# Patient Record
Sex: Female | Born: 1970 | Race: White | Hispanic: No | Marital: Married | Smoking: Former smoker
Health system: Southern US, Community
[De-identification: ages and names within clinical notes are randomized; demographics above are authoritative.]

## PROBLEM LIST (undated history)

## (undated) DIAGNOSIS — I720 Aneurysm of carotid artery: Secondary | ICD-10-CM

## (undated) DIAGNOSIS — I7773 Dissection of renal artery: Secondary | ICD-10-CM

## (undated) DIAGNOSIS — Z5189 Encounter for other specified aftercare: Secondary | ICD-10-CM

## (undated) DIAGNOSIS — I10 Essential (primary) hypertension: Secondary | ICD-10-CM

## (undated) DIAGNOSIS — M1990 Unspecified osteoarthritis, unspecified site: Secondary | ICD-10-CM

## (undated) HISTORY — PX: OTHER SURGICAL HISTORY: SHX169

## (undated) HISTORY — DX: Encounter for other specified aftercare: Z51.89

## (undated) HISTORY — DX: Aneurysm of carotid artery: I72.0

## (undated) HISTORY — DX: Essential (primary) hypertension: I10

## (undated) HISTORY — DX: Unspecified osteoarthritis, unspecified site: M19.90

## (undated) HISTORY — DX: Dissection of renal artery: I77.73

## (undated) HISTORY — PX: TUBAL LIGATION: SHX77

---

## 1998-09-12 ENCOUNTER — Other Ambulatory Visit: Admission: RE | Admit: 1998-09-12 | Discharge: 1998-09-12 | Payer: Self-pay | Admitting: Obstetrics & Gynecology

## 1999-08-27 ENCOUNTER — Other Ambulatory Visit: Admission: RE | Admit: 1999-08-27 | Discharge: 1999-08-27 | Payer: Self-pay | Admitting: Obstetrics and Gynecology

## 1999-11-09 ENCOUNTER — Inpatient Hospital Stay (HOSPITAL_COMMUNITY): Admission: AD | Admit: 1999-11-09 | Discharge: 1999-11-09 | Payer: Self-pay | Admitting: Obstetrics and Gynecology

## 2000-03-01 ENCOUNTER — Inpatient Hospital Stay (HOSPITAL_COMMUNITY): Admission: AD | Admit: 2000-03-01 | Discharge: 2000-03-01 | Payer: Self-pay | Admitting: Obstetrics & Gynecology

## 2000-03-26 ENCOUNTER — Inpatient Hospital Stay (HOSPITAL_COMMUNITY): Admission: AD | Admit: 2000-03-26 | Discharge: 2000-03-28 | Payer: Self-pay | Admitting: Obstetrics and Gynecology

## 2000-04-19 ENCOUNTER — Observation Stay (HOSPITAL_COMMUNITY): Admission: AD | Admit: 2000-04-19 | Discharge: 2000-04-20 | Payer: Self-pay | Admitting: Obstetrics and Gynecology

## 2000-04-19 ENCOUNTER — Encounter (INDEPENDENT_AMBULATORY_CARE_PROVIDER_SITE_OTHER): Payer: Self-pay

## 2000-04-19 ENCOUNTER — Encounter: Payer: Self-pay | Admitting: Obstetrics and Gynecology

## 2000-07-28 ENCOUNTER — Ambulatory Visit (HOSPITAL_COMMUNITY): Admission: RE | Admit: 2000-07-28 | Discharge: 2000-07-28 | Payer: Self-pay | Admitting: Obstetrics and Gynecology

## 2000-07-28 ENCOUNTER — Encounter: Payer: Self-pay | Admitting: Obstetrics and Gynecology

## 2000-09-24 ENCOUNTER — Ambulatory Visit (HOSPITAL_COMMUNITY): Admission: RE | Admit: 2000-09-24 | Discharge: 2000-09-24 | Payer: Self-pay | Admitting: Obstetrics and Gynecology

## 2000-10-23 ENCOUNTER — Other Ambulatory Visit: Admission: RE | Admit: 2000-10-23 | Discharge: 2000-10-23 | Payer: Self-pay | Admitting: Obstetrics and Gynecology

## 2002-01-10 ENCOUNTER — Other Ambulatory Visit: Admission: RE | Admit: 2002-01-10 | Discharge: 2002-01-10 | Payer: Self-pay | Admitting: Obstetrics and Gynecology

## 2002-08-19 ENCOUNTER — Inpatient Hospital Stay (HOSPITAL_COMMUNITY): Admission: AD | Admit: 2002-08-19 | Discharge: 2002-08-21 | Payer: Self-pay | Admitting: Obstetrics and Gynecology

## 2002-09-20 ENCOUNTER — Inpatient Hospital Stay (HOSPITAL_COMMUNITY): Admission: AD | Admit: 2002-09-20 | Discharge: 2002-09-20 | Payer: Self-pay | Admitting: Obstetrics and Gynecology

## 2002-09-26 ENCOUNTER — Inpatient Hospital Stay (HOSPITAL_COMMUNITY): Admission: AD | Admit: 2002-09-26 | Discharge: 2002-09-30 | Payer: Self-pay | Admitting: Obstetrics & Gynecology

## 2002-09-27 ENCOUNTER — Encounter (INDEPENDENT_AMBULATORY_CARE_PROVIDER_SITE_OTHER): Payer: Self-pay

## 2003-02-16 ENCOUNTER — Other Ambulatory Visit: Admission: RE | Admit: 2003-02-16 | Discharge: 2003-02-16 | Payer: Self-pay | Admitting: Obstetrics and Gynecology

## 2003-06-27 ENCOUNTER — Emergency Department (HOSPITAL_COMMUNITY): Admission: EM | Admit: 2003-06-27 | Discharge: 2003-06-28 | Payer: Self-pay | Admitting: Emergency Medicine

## 2004-08-21 ENCOUNTER — Other Ambulatory Visit: Admission: RE | Admit: 2004-08-21 | Discharge: 2004-08-21 | Payer: Self-pay | Admitting: Obstetrics and Gynecology

## 2006-05-03 ENCOUNTER — Emergency Department (HOSPITAL_COMMUNITY): Admission: EM | Admit: 2006-05-03 | Discharge: 2006-05-03 | Payer: Self-pay | Admitting: Emergency Medicine

## 2007-03-11 ENCOUNTER — Ambulatory Visit: Payer: Self-pay | Admitting: *Deleted

## 2007-03-11 DIAGNOSIS — I720 Aneurysm of carotid artery: Secondary | ICD-10-CM

## 2007-03-11 HISTORY — DX: Aneurysm of carotid artery: I72.0

## 2007-03-12 ENCOUNTER — Encounter: Admission: RE | Admit: 2007-03-12 | Discharge: 2007-03-12 | Payer: Self-pay | Admitting: *Deleted

## 2007-03-18 ENCOUNTER — Ambulatory Visit (HOSPITAL_COMMUNITY): Admission: RE | Admit: 2007-03-18 | Discharge: 2007-03-18 | Payer: Self-pay | Admitting: Interventional Radiology

## 2007-06-21 ENCOUNTER — Encounter: Admission: RE | Admit: 2007-06-21 | Discharge: 2007-06-21 | Payer: Self-pay | Admitting: Gastroenterology

## 2007-06-24 ENCOUNTER — Ambulatory Visit: Payer: Self-pay | Admitting: *Deleted

## 2007-10-28 ENCOUNTER — Ambulatory Visit: Payer: Self-pay | Admitting: *Deleted

## 2007-10-28 ENCOUNTER — Encounter: Admission: RE | Admit: 2007-10-28 | Discharge: 2007-10-28 | Payer: Self-pay | Admitting: *Deleted

## 2008-02-17 ENCOUNTER — Ambulatory Visit: Payer: Self-pay | Admitting: *Deleted

## 2008-02-17 ENCOUNTER — Encounter: Admission: RE | Admit: 2008-02-17 | Discharge: 2008-02-17 | Payer: Self-pay | Admitting: *Deleted

## 2008-06-15 ENCOUNTER — Encounter: Admission: RE | Admit: 2008-06-15 | Discharge: 2008-06-15 | Payer: Self-pay | Admitting: *Deleted

## 2008-06-15 ENCOUNTER — Ambulatory Visit: Payer: Self-pay | Admitting: *Deleted

## 2008-12-21 ENCOUNTER — Ambulatory Visit: Payer: Self-pay | Admitting: *Deleted

## 2008-12-21 ENCOUNTER — Encounter: Admission: RE | Admit: 2008-12-21 | Discharge: 2008-12-21 | Payer: Self-pay | Admitting: *Deleted

## 2009-06-20 ENCOUNTER — Emergency Department (HOSPITAL_COMMUNITY): Admission: EM | Admit: 2009-06-20 | Discharge: 2009-06-21 | Payer: Self-pay | Admitting: Emergency Medicine

## 2009-06-23 ENCOUNTER — Ambulatory Visit: Payer: Self-pay | Admitting: Surgery

## 2009-06-23 ENCOUNTER — Inpatient Hospital Stay (HOSPITAL_COMMUNITY): Admission: EM | Admit: 2009-06-23 | Discharge: 2009-06-28 | Payer: Self-pay | Admitting: Emergency Medicine

## 2009-06-25 ENCOUNTER — Encounter: Payer: Self-pay | Admitting: Surgery

## 2009-07-01 ENCOUNTER — Inpatient Hospital Stay (HOSPITAL_COMMUNITY): Admission: EM | Admit: 2009-07-01 | Discharge: 2009-07-04 | Payer: Self-pay | Admitting: Emergency Medicine

## 2009-07-23 ENCOUNTER — Ambulatory Visit: Payer: Self-pay | Admitting: Surgery

## 2009-07-27 ENCOUNTER — Encounter: Admission: RE | Admit: 2009-07-27 | Discharge: 2009-07-27 | Payer: Self-pay | Admitting: Surgery

## 2009-07-30 ENCOUNTER — Ambulatory Visit: Payer: Self-pay | Admitting: Surgery

## 2009-10-29 ENCOUNTER — Ambulatory Visit (HOSPITAL_COMMUNITY): Admission: RE | Admit: 2009-10-29 | Discharge: 2009-10-29 | Payer: Self-pay | Admitting: Surgery

## 2009-10-31 ENCOUNTER — Ambulatory Visit (HOSPITAL_COMMUNITY): Admission: RE | Admit: 2009-10-31 | Discharge: 2009-10-31 | Payer: Self-pay | Admitting: Surgery

## 2009-11-12 ENCOUNTER — Ambulatory Visit: Payer: Self-pay | Admitting: Surgery

## 2010-06-06 ENCOUNTER — Encounter: Admission: RE | Admit: 2010-06-06 | Discharge: 2010-06-06 | Payer: Self-pay | Admitting: Surgery

## 2010-06-07 ENCOUNTER — Encounter: Admission: RE | Admit: 2010-06-07 | Discharge: 2010-06-07 | Payer: Self-pay | Admitting: Surgery

## 2010-06-10 ENCOUNTER — Ambulatory Visit: Payer: Self-pay | Admitting: Surgery

## 2010-12-02 ENCOUNTER — Encounter: Payer: Self-pay | Admitting: Surgery

## 2010-12-02 ENCOUNTER — Encounter: Payer: Self-pay | Admitting: *Deleted

## 2011-02-10 LAB — BUN: BUN: 12 mg/dL (ref 6–23)

## 2011-02-10 LAB — CREATININE, SERUM
Creatinine, Ser: 0.73 mg/dL (ref 0.4–1.2)
GFR calc Af Amer: >60 mL/min (ref >60)

## 2011-02-15 LAB — COMPREHENSIVE METABOLIC PANEL
AST: 23 U/L (ref 0–37)
Albumin: 4.1 g/dL (ref 3.5–5.2)
BUN: 9 mg/dL (ref 6–23)
Calcium: 9.2 mg/dL (ref 8.4–10.5)
Chloride: 100 mEq/L (ref 96–112)
Creatinine, Ser: 0.69 mg/dL (ref 0.4–1.2)
GFR calc Af Amer: >60 mL/min (ref >60)
Total Bilirubin: 0.7 mg/dL (ref 0.3–1.2)
Total Protein: 7.6 g/dL (ref 6.0–8.3)

## 2011-02-15 LAB — FACTOR 5 LEIDEN

## 2011-02-15 LAB — DIFFERENTIAL
Basophils Absolute: 0.1 10*3/uL (ref 0.0–0.1)
Basophils Absolute: 0.1 10*3/uL (ref 0.0–0.1)
Basophils Relative: 1 % (ref 0–1)
Basophils Relative: 0 % (ref 0–1)
Eosinophils Absolute: 0.1 10*3/uL (ref 0.0–0.7)
Eosinophils Absolute: 0.2 10*3/uL (ref 0.0–0.7)
Eosinophils Relative: 1 % (ref 0–5)
Lymphocytes Relative: 19 % (ref 12–46)
Lymphocytes Relative: 11 % — ABNORMAL LOW (ref 12–46)
Lymphocytes Relative: 18 % (ref 12–46)
Lymphs Abs: 1.5 10*3/uL (ref 0.7–4.0)
Lymphs Abs: 1.6 10*3/uL (ref 0.7–4.0)
Monocytes Absolute: 1.2 10*3/uL — ABNORMAL HIGH (ref 0.1–1.0)
Monocytes Relative: 10 % (ref 3–12)
Monocytes Relative: 12 % (ref 3–12)
Neutro Abs: 4.2 10*3/uL (ref 1.7–7.7)
Neutro Abs: 10.6 10*3/uL — ABNORMAL HIGH (ref 1.7–7.7)
Neutrophils Relative %: 68 % (ref 43–77)
Neutrophils Relative %: 68 % (ref 43–77)

## 2011-02-15 LAB — URINE CULTURE
Colony Count: 4000
Colony Count: NO GROWTH

## 2011-02-15 LAB — URINALYSIS, ROUTINE W REFLEX MICROSCOPIC
Glucose, UA: NEGATIVE mg/dL
Hgb urine dipstick: NEGATIVE
Ketones, ur: >80 mg/dL — AB
Leukocytes, UA: NEGATIVE
Leukocytes, UA: NEGATIVE
Nitrite: NEGATIVE
Nitrite: NEGATIVE
Nitrite: NEGATIVE
Protein, ur: NEGATIVE mg/dL
Protein, ur: NEGATIVE mg/dL
Specific Gravity, Urine: 1.026 (ref 1.005–1.030)
Specific Gravity, Urine: 1.017 (ref 1.005–1.030)
Urobilinogen, UA: 1 mg/dL (ref 0.0–1.0)
Urobilinogen, UA: 0.2 mg/dL (ref 0.0–1.0)
Urobilinogen, UA: 1 mg/dL (ref 0.0–1.0)
pH: 7 (ref 5.0–8.0)

## 2011-02-15 LAB — POTASSIUM: Potassium: 4.3 mEq/L (ref 3.5–5.1)

## 2011-02-15 LAB — CBC
HCT: 36.5 % (ref 36.0–46.0)
HCT: 41.4 % (ref 36.0–46.0)
HCT: 43.1 % (ref 36.0–46.0)
Hemoglobin: 12.5 g/dL (ref 12.0–15.0)
MCHC: 34.4 g/dL (ref 30.0–36.0)
MCHC: 34.6 g/dL (ref 30.0–36.0)
MCHC: 34.8 g/dL (ref 30.0–36.0)
MCHC: 34.5 g/dL (ref 30.0–36.0)
MCHC: 35.3 g/dL (ref 30.0–36.0)
MCHC: 35.6 g/dL (ref 30.0–36.0)
MCV: 89.1 fL (ref 78.0–100.0)
MCV: 85.4 fL (ref 78.0–100.0)
MCV: 86 fL (ref 78.0–100.0)
MCV: 87.1 fL (ref 78.0–100.0)
MCV: 87.5 fL (ref 78.0–100.0)
Platelets: 407 10*3/uL — ABNORMAL HIGH (ref 150–400)
Platelets: 302 10*3/uL (ref 150–400)
Platelets: 349 10*3/uL (ref 150–400)
RBC: 4.07 MIL/uL (ref 3.87–5.11)
RBC: 4.57 MIL/uL (ref 3.87–5.11)
RBC: 4.19 MIL/uL (ref 3.87–5.11)
RBC: 4.54 MIL/uL (ref 3.87–5.11)
RDW: 12.7 % (ref 11.5–15.5)
RDW: 12.4 % (ref 11.5–15.5)
RDW: 12.6 % (ref 11.5–15.5)
WBC: 6.1 10*3/uL (ref 4.0–10.5)
WBC: 9.1 10*3/uL (ref 4.0–10.5)

## 2011-02-15 LAB — BASIC METABOLIC PANEL
BUN: 7 mg/dL (ref 6–23)
BUN: 8 mg/dL (ref 6–23)
CO2: 27 mEq/L (ref 19–32)
CO2: 27 mEq/L (ref 19–32)
CO2: 24 mEq/L (ref 19–32)
Calcium: 8.5 mg/dL (ref 8.4–10.5)
Calcium: 8.8 mg/dL (ref 8.4–10.5)
Calcium: 8.9 mg/dL (ref 8.4–10.5)
Chloride: 101 mEq/L (ref 96–112)
Chloride: 103 mEq/L (ref 96–112)
Chloride: 102 mEq/L (ref 96–112)
Chloride: 104 mEq/L (ref 96–112)
Chloride: 96 mEq/L (ref 96–112)
Creatinine, Ser: 0.62 mg/dL (ref 0.4–1.2)
Creatinine, Ser: 0.63 mg/dL (ref 0.4–1.2)
Creatinine, Ser: 0.75 mg/dL (ref 0.4–1.2)
Creatinine, Ser: 0.57 mg/dL (ref 0.4–1.2)
Creatinine, Ser: 0.69 mg/dL (ref 0.4–1.2)
GFR calc Af Amer: >60 mL/min (ref >60)
GFR calc Af Amer: >60 mL/min (ref >60)
GFR calc Af Amer: >60 mL/min (ref >60)
GFR calc Af Amer: >60 mL/min (ref >60)
GFR calc Af Amer: >60 mL/min (ref >60)
GFR calc non Af Amer: >60 mL/min (ref >60)
GFR calc non Af Amer: >60 mL/min (ref >60)
GFR calc non Af Amer: >60 mL/min (ref >60)
Glucose, Bld: 94 mg/dL (ref 70–99)
Potassium: 3.9 mEq/L (ref 3.5–5.1)
Potassium: 3 mEq/L — ABNORMAL LOW (ref 3.5–5.1)
Potassium: 4.2 mEq/L (ref 3.5–5.1)
Sodium: 137 mEq/L (ref 135–145)
Sodium: 137 mEq/L (ref 135–145)
Sodium: 137 mEq/L (ref 135–145)

## 2011-02-15 LAB — RENAL FUNCTION PANEL
CO2: 27 mEq/L (ref 19–32)
Chloride: 100 mEq/L (ref 96–112)
Glucose, Bld: 95 mg/dL (ref 70–99)
Potassium: 2.9 mEq/L — ABNORMAL LOW (ref 3.5–5.1)
Sodium: 133 mEq/L — ABNORMAL LOW (ref 135–145)

## 2011-02-15 LAB — POCT I-STAT, CHEM 8
BUN: 11 mg/dL (ref 6–23)
Calcium, Ion: 1.14 mmol/L (ref 1.12–1.32)
Chloride: 103 mEq/L (ref 96–112)
Glucose, Bld: 93 mg/dL (ref 70–99)
HCT: 42 % (ref 36.0–46.0)

## 2011-02-15 LAB — PROTEIN C ACTIVITY: Protein C Activity: 131 % (ref 75–133)

## 2011-02-15 LAB — BETA-2-GLYCOPROTEIN I ABS, IGG/M/A
Beta-2 Glyco I IgG: <20 U/mL (ref <20)
Beta-2-Glycoprotein I IgA: <10 U/mL (ref <20)

## 2011-02-15 LAB — URINE MICROSCOPIC-ADD ON

## 2011-02-15 LAB — LUPUS ANTICOAGULANT PANEL: Lupus Anticoagulant: NOT DETECTED

## 2011-02-15 LAB — APTT: aPTT: 29 seconds (ref 24–37)

## 2011-02-15 LAB — PROTIME-INR
INR: 0.9 (ref 0.00–1.49)
INR: 1.1 (ref 0.00–1.49)

## 2011-02-15 LAB — PROTEIN S ACTIVITY: Protein S Activity: 73 % (ref 69–129)

## 2011-02-15 LAB — FACTOR 8 ASSAY: Coagulation Factor VIII: 112 % (ref 73–140)

## 2011-02-15 LAB — TYPE AND SCREEN: ABO/RH(D): O POS

## 2011-02-15 LAB — CARDIOLIPIN ANTIBODIES, IGG, IGM, IGA: Anticardiolipin IgA: <10 [APL'U] — ABNORMAL LOW (ref <12)

## 2011-02-15 LAB — RHEUMATOID FACTOR: Rhuematoid fact SerPl-aCnc: 22 IU/mL — ABNORMAL HIGH (ref 0–20)

## 2011-02-15 LAB — HOMOCYSTEINE: Homocysteine: 7.7 umol/L (ref 4.0–15.4)

## 2011-02-15 LAB — PROTEIN S, TOTAL: Protein S Ag, Total: 95 % (ref 70–140)

## 2011-02-15 LAB — PROTEIN C, TOTAL: Protein C, Total: 92 % (ref 70–140)

## 2011-03-25 ENCOUNTER — Other Ambulatory Visit: Payer: Self-pay | Admitting: Surgery

## 2011-03-25 NOTE — Assessment & Plan Note (Signed)
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   A CT angiogram was carried out today.  This reveals no significant  change with evidence of a saccular pseudoaneurysm of the left internal  carotid artery at the base of the skull.  The right internal carotid  artery appears normal at this time.   She remains on aspirin 325 mg daily.  Occasional headaches.  No visual  disturbance.  No sensory or motor deficit.  Denies speech problems.   The patient appears generally well.  BP 132/95, pulse 77 per minute and  regular, respirations 16 per minute.  No carotid bruits.  Cranial nerves  intact.  Strength equal bilaterally.   I have had a long discussion with the patient and her husband regarding  her employment as a Runner, broadcasting/film/video.  I do think it would be in her best  interest at this time to remain off work for the time-being until  further followup can be arranged regarding this pseudoaneurysm in her  left internal carotid artery.  She will remain excused from work at this  time from March 09, 2007 until November 11, 2007.  I will plan to follow up with her in mid December with a  repeat CT angiogram of the neck at that time.   Balinda Quails, M.D.  Electronically Signed   PGH/MEDQ  D:  06/24/2007  T:  06/25/2007  Job:  [id number]   cc:   Donalee Citrin, M.D.  Duncan Dull, M.D.

## 2011-03-25 NOTE — Assessment & Plan Note (Signed)
OFFICE VISIT   Brittany Gibson, Brittany Gibson  DOB:  12/01/1970                                       06/10/2010  EAVWU#:[id number]   REASON FOR VISIT:  Follow up carotid pseudoaneurysm and left renal  dissection.   HISTORY:  This is a 40 year old female well-known to me for a left renal  artery dissection.  She first met Dr. Madilyn Fireman in May 2008 for carotid  pseudoaneurysm which was following a trauma.  I had sent her for a  vasculitis workup.  She comes back today on her continued surveillance  program.  She states that approximately 6 months ago she had a stabbing-  like pain on her right flank which did spontaneously resolve.  She has  been having right-sided back pain for the past 2-3 days.  She feels this  may be cyclical.  Otherwise, however, she is doing well.  She has seen  Dr. Peggye Form who has raised the possibility that this could be a variation  of fibromuscular dysplasia.   REVIEW OF SYSTEMS:  GENERAL:  Negative for fevers, chills, weight gain,  weight loss.  VASCULAR:  Is as above.  CARDIAC:  Negative for chest pain or chest pressure.  GI:  Positive for back and flank pain on the right side.  PULMONARY:  Negative.  HEME:  Negative.  GU:  Negative.  ENT:  Negative.  MUSCULOSKELETAL:  Negative.   All other review of systems are negative as documented in the encounter  form.   PHYSICAL EXAMINATION:  Heart rate 91, blood pressure 122/90, respiratory  rate 20.  General:  She is well-appearing, in no distress.  HEENT:  Within normal limits.  Respirations are nonlabored.  Cardiovascular:  Regular rate and rhythm.  Abdomen:  Soft, nontender.  There is abdominal  bruit more prominent on the left.  Musculoskeletal:  Without major  deformities.  Neuro:  She has no focal deficits or weakness.  Skin:  Without rash.   DIAGNOSTIC STUDIES:  I have reviewed her MRA of her abdomen which shows  stable narrowing of the left renal artery without new dissection.  MRA  of  the neck reveals stable left carotid pseudoaneurysm at the base of  the skull.   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   PLAN:  Actually has no new symptoms today.  Her MRAs are stable in  appearance.  We will plan on continuing her surveillance with an MRA.  The next scan will be in 1 year.   I spoke with Dr. Peggye Form regarding the possibility of fibromuscular  dysplasia after having reviewed some of the articles.  This could  potentially represent a variation of fibromuscular dysplasia where the  string of bead appearance is not prominent.  I told the patient that I  see multiple cases of fibromuscular dysplasia per year and they all have  the classic string of beads appearance on angiography.  She does not  have this finding; however, there does appear to be a variation which  does not have these classic angiographic findings which could  potentially be the etiology of her dissections.  I do not think we will  ever be able to prove this, but it is certainly a possibility.     Jorge Ny, MD  Electronically Signed   VWB/MEDQ  D:  06/10/2010  T:  06/11/2010  Job:  [id number]   cc:   [name], M.D.

## 2011-03-25 NOTE — Assessment & Plan Note (Signed)
OFFICE VISIT   Brittany Gibson, COIRO B  DOB:  23-May-1971                                       07/30/2009  ZOXWR#:[id number]   REASON FOR VISIT:  Follow up left renal dissection.   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  She does also have a history of traumatic pseudoaneurysm of her  bilateral internal carotid artery.  She had been followed by Dr. Madilyn Fireman  for this.  Her most recent evaluation was in February with CT scan which  showed distal left renal artery pseudoaneurysm measuring 1.4-mm.  Due to  her history, she has been seen by a rheumatologist, her workup was  negative.  She called the office last week and had new pain on the right  side of her body and for that reason I elected to repeat a CT scan to  further evaluate this to see if she developed something on the right  side.  She has a myriad of complaints today including tingling and sharp  pains in her left arm, also in her left flank and in her left back.  She  is concerned that she may have walking pneumonia as her daughter has  been treated with a Z-Pak.  She is also concerned that this all started  when she started taking Plavix and was concerned that this may be the  result of her Plavix.   PHYSICAL EXAMINATION:  Blood pressure 120/80, pulse 78.  She is well-  appearing, in no distress.  Her abdomen is soft, nontender.  She has no  costochondral tenderness.  Her lungs are clear.  Carotid arteries are  clear.   CT scan shows minimal change in the dissection of the left main renal  artery, there is improved enhancement in the periphery of the kidney.   ASSESSMENT/PLAN:  Spontaneous left renal artery dissection, traumatic  bilateral carotid dissection,   PLAN:  Patient will be followed up in 3 months with an MRA of her neck  and abdomen.  She is concerned that some of her symptoms started  with  her Plavix; therefore, I think it is okay to stop her Plavix at this  point in time, I am going to maintain her on a full aspirin.  She is set  to see rheumatology this Friday.  There has been discussion of a  possible Ehlers-Danlos syndrome.  Again, I will see her back in 3  months.   Jorge Ny, MD  Electronically Signed   VWB/MEDQ  D:  07/30/2009  T:  07/31/2009  Job:  [id number]

## 2011-03-25 NOTE — Assessment & Plan Note (Signed)
OFFICE VISIT   Brittany Gibson, Brittany Gibson  DOB:  [birth year]                                       12/21/2008  ZOXWR#:[id number]   The patient returns today for continued follow-up of traumatic left  internal carotid dissection with pseudoaneurysm formation.  Followed on  a regular basis.  No recent headaches.  Denies pulsatile tinnitus.  No  recent neck pain.  No visual disturbance.  No motor, sensory or speech  problems.   CT angiogram reveals a stable pseudoaneurysm of the distal left internal  carotid artery at the skull base 1.4-mm in diameter.   Patient appears well.  Alert and oriented.  BP is 143/88 left arm,  131/86 right arm, pulse is 84 per minute.  Cranial nerves are intact.  Strength equal bilaterally.  Reflexes 1+.  No carotid bruits.   The patient remains quite stable without change in distal left internal  carotid artery pseudoaneurysm.  Will plan follow-up again in 6 months  with repeat CT angiogram.   Balinda Quails, M.D.  Electronically Signed   PGH/MEDQ  D:  12/21/2008  T:  12/22/2008  Job:  [id number]

## 2011-03-25 NOTE — Assessment & Plan Note (Signed)
OFFICE VISIT   CYLIE, DOR  DOB:  1970/12/27                                       06/15/2008  ZOXWR#:[id number]   The patient returned to the office today with a CT angiogram of the  neck.  She has known left internal carotid artery dissection with  pseudoaneurysm formation at the distal left internal carotid artery,  adjacent to the skull base.  CT scan of this reveals no change.  Right  internal carotid artery reveals a healed dissection.  The patient  complains of occasional headaches.  No neck pain.  No visual  disturbance.  No sensory or motor deficit.  No speech problems.   PHYSICAL EXAMINATION:  General:  The patient appears generally well.  Alert and oriented, in no distress.  Vital signs:  BP 137/99 left arm,  124/85 right arm, pulse is 95 per minute.  Neck:  No carotid bruits.  Cranial nerves intact.  Strength equal bilaterally.   Overall the patient is doing well.  We will plan to follow up with her  again in 6 months with a CT angiogram of the neck.   I have given her permission to return to work as of 06/28/2008.  Avoid  heavy lifting and significant exertional activities.   Balinda Quails, M.D.  Electronically Signed   PGH/MEDQ  D:  06/15/2008  T:  06/16/2008  Job:  [id number]

## 2011-03-25 NOTE — Discharge Summary (Signed)
Brittany Gibson, ZETTLEMOYER               ACCOUNT NO.:  [id number]   MEDICAL RECORD NO.:  [id number]          PATIENT TYPE:  INP   LOCATION:  2029                         FACILITY:  [facility]   PHYSICIAN:  Juleen China IV, MDDATE OF BIRTH:  [birth year]   DATE OF ADMISSION:  07/01/2009  DATE OF DISCHARGE:  07/04/2009                               DISCHARGE SUMMARY   FINAL DISCHARGE DIAGNOSES:  1. Abdominal discomfort.  2. Left renal artery with spontaneous dissection.   PROCEDURE PERFORMED:  Pain control.   COMPLICATIONS:  None.   CONDITION AT DISCHARGE:  Stable, improving.   DISCHARGE MEDICATIONS:  1. Hydrocodone/APAP 7.5/750 one p.o. q.6 h. p.r.n.  2. Coumadin 10 mg p.o. q.6 h. p.r.n.  3. Plavix 75 mg p.o. daily.  4. Lisinopril 10 mg p.o. b.i.d.  5. Aspirin 81 mg p.o. daily.   DISPOSITION:  She is being discharged home in stable condition.   FOLLOWUP:  She is to keep all of her previously scheduled appointments.  She may call us for questions.   BRIEF IDENTIFYING STATEMENT:  For complete details, please refer the  history and physical.  Briefly, this 40 year old woman was admitted on  June 23, 2009 for a left renal artery spontaneous dissection.  She was  admitted for pain and blood pressure control.  She was treated, became  painfree, and was discharged home.  She presented to the emergency  department on July 01, 2009 complaining of abdominal pain.  She was  admitted for pain control.   HOSPITAL COURSE:  She was admitted to a bed on a Surgical convalescent  floor.  She was carefully followed.  She became painfree and was  discharged home on July 04, 2009.  She is to follow up in 2 weeks with  an abdominal and pelvic CTA which has been previously scheduled.      Wilmon Arms, PA      V. Charlena Cross, MD  Electronically Signed    KEL/MEDQ  D:  07/04/2009  T:  07/04/2009  Job:  [id number]

## 2011-03-25 NOTE — Assessment & Plan Note (Signed)
OFFICE VISIT   Brittany Gibson, Brittany Gibson  DOB:  1971/10/08                                       10/28/2007  NFAOZ#:[id number]   The patient is seen today for scheduled followup.  Repeat CT angiogram  of the neck carried out.  This continues to reveal evidence of a  posttraumatic pseudo-aneurysm in the left internal carotid artery at the  base of the skull.  This has not changed significantly in size or  configuration from a study carried out in August of this year.   The patient continues to have occasional shooting pains in the left neck  and base of the skull.  She has had no visual disturbance.  No sensory  or motor deficit.  No speech problems.   BP 131/87 left arm, 129/91 right arm, pulse 109 per minute.  No carotid  bruits present.  Cranial nerves intact.  Strength is equal bilaterally.  Normal gait.   I talked further with the patient about potential return to work.  I do  think we should wait at least a year before we allow her to return to  work.  Therefore, I will plan to follow up with her here in March with a  repeat CT angiogram of the neck.   Balinda Quails, M.D.  Electronically Signed   PGH/MEDQ  D:  10/28/2007  T:  10/29/2007  Job:  [id number]

## 2011-03-25 NOTE — Assessment & Plan Note (Signed)
OFFICE VISIT   Brittany, Gibson  DOB:  [birth year]                                       11/12/2009  WUJWJ#:[id number]   Patient returns today for follow-up.  Since I last saw her, she has had  a couple episodes of back pain as well as some cramping in her right toe  with some shooting pain in her right arm.  These appear to be self-  limited,  overall however, I think she has been doing very well.  She is  slowly returning to her activities.  She comes in today after having  undergone an MRI of her head and neck as well as of her abdomen.  She  has a history beginning in May 2008 of having a large saccular out-  pouching of her left internal carotid artery at the base of her skull  that was consistent with a post-traumatic pseudoaneurysm.  She also had  a dissection of the right internal carotid artery that was consistent  with trauma.  We have been following these routinely over time.  The  patient suffered a left renal dissection in August 2010 for which we are  following now.   The patient continues to have high blood pressure, which she is taking  medication for.  She continues to be a nonsmoker.   Review of systems is positive for dizziness, headaches and joint pain.  All other review of systems are negative, as documented in the encounter  form.   PHYSICAL EXAMINATION:  Heart rate is 90.  Blood pressure 122/78.  Temperature is 97.8.  General:  She is well-appearing in no distress.  HEENT is normal.  Lungs are clear.  Cardiovascular:  Regular rate and  rhythm.  I do not hear any carotid or abdominal bruits.  Her abdomen is  soft, nontender.  No pulsatile mass.  No hepatosplenomegaly.  Musculoskeletal is without major deformities.  Neurologically, she has  no focal weakness or paresthesias.  Her skin is without rash.  Psych:  She has normal affect.   MRI was independently reviewed today.  MRI of the abdomen shows a stable  narrowing of the left  renal artery with no new dissection.  There was  scarring within the left kidney but no areas of hypoperfusion.   MRI of the head and neck revealed pseudoaneurysm of the terminal left  internal carotid artery, unchanged from prior studies.   PLAN:  1. Carotid pseudoaneurysm and dissection.  This process appears stable      at this time.  We will plan on repeating her imaging in 6 months,      and at that time, we can extend this out to a yearly evaluation.  2. Renal dissection:  This appears to be stable at this time.  The      kidney is now perfused.  However, there is some scarring.  I think      that is likely the cause for her occasional discomfort.  We will      look at this again in 6 months with an MRI.     Brittany Ny, MD  Electronically Signed   VWB/MEDQ  D:  11/12/2009  T:  11/13/2009  Job:  [id number]
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
## 2011-03-25 NOTE — Consult Note (Signed)
Brittany Gibson, Brittany Gibson               ACCOUNT NO.:  [id number]   MEDICAL RECORD NO.:  [id number]          PATIENT TYPE:  INP   LOCATION:  5118                         FACILITY:  [facility]   PHYSICIAN:  Brittany Gibson, MDDATE OF BIRTH:  05/24/1971   DATE OF CONSULTATION:  06/23/2009  DATE OF DISCHARGE:                                 CONSULTATION   REASON FOR CONSULT:  Renal infarct history.   CONSULTING PHYSICIAN:  Brittany Savers, MD   HISTORY:  This is a 40 year old female with history of left lower  quadrant and left flank pain beginning approximately 5 days ago.  This  was associated with some nausea as well as anorexia.  She came to the  emergency department 2 days ago with worsening pain.  A CT scan was  performed, which reveals wedge-shaped infarcts in the left kidney with  perinephric stranding.  Urinalysis was performed, which was negative at  that time.  She was treated for constipation and after a bowel movement,  her pain did not resolve.  She has now been admitted to the hospital.   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  Madilyn Gibson.  Most recent visit was in February.  At that time, CT scan  revealed a stable pseudoaneurysm of the distal left internal carotid  artery at the skull base measuring approximately 1.4 mm in diameter.  The dissection in the right internal carotid artery has healed.  The  patient also has a history of multiple miscarriages (3) as well as  postpartum hemorrhage resulting in some severe anemia, requiring blood  transfusions.  With the findings of the renal infarct, I have been asked  to evaluate her especially since she has had history of miscarriages in  the past.   PAST MEDICAL HISTORY:  1. 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  2. History of three miscarriages.  3. Postpartum hemorrhage.   SOCIAL HISTORY:   Negative for tobacco.  Negative for alcohol.   FAMILY HISTORY:  Positive for hypertension.  No history of clotting  disorders or bleeding diatheses.  Grandfather had a stroke in his early  39s.   REVIEW OF SYSTEMS:  Negative for fevers or chills.  Negative for  diarrhea or constipation.  Otherwise, as detailed in the HPI.   PHYSICAL EXAMINATION:  VITAL SIGNS:  Temperature is 99.4, pulse 85,  respirations 18, blood pressure 133/96, and O2 sats 98% on room air.  GENERAL:  She is well appearing, in no distress.  HEENT:  She is normocephalic and atraumatic.  Pupils equal.  Sclerae  anicteric.  NECK:  Supple.  No JVD.  CARDIOVASCULAR:  Regular rate and rhythm.  LUNGS:  Clear bilaterally.  ABDOMEN:  Soft.  There is mild left flank tenderness.  EXTREMITIES:  Warm and well perfused.   LABORATORY VALUES:  Sodium is 133, potassium is 2.9, and creatinine 0.6.  CBC:  White count is 11, hemoglobin is 13.8, and platelet count is 289.   CT scan:  Left renal infarct.  ASSESSMENT AND PLAN:  Left renal infarct.   PLAN:  I feel that based on after reviewing the patient's CT scan that  her flank pain is related to her left renal infarct, this is  approximately 25 days old, and therefore I do not feel any intervention  is warranted.  I think the next step is to determine the etiology of the  infarct.  I do not think this is related to her traumatic carotid  dissection.  I would recommend repeating her CT scan at this time,  making it a CT angiogram to evaluate for possible dissection with also  one look at the chest as well to rule out any embolic possibilities.  She should also get an echocardiogram to rule out for embolic source and  then I would send off a hypercoagulable panel as well as a vasculitis  panel.  We will continue to follow the patient.      Brittany Ny, MD  Electronically Signed     VWB/MEDQ  D:  06/23/2009  T:  06/23/2009  Job:  [id number]

## 2011-03-25 NOTE — Assessment & Plan Note (Signed)
OFFICE VISIT   Brittany Gibson, Brittany Gibson  DOB:  Sep 28, 1971                                       07/23/2009  DGUYQ#:[id number]   REASON FOR VISIT:  Follow-up left renal dissection.   HISTORY:  This is a 40 year old female who was admitted to [facility] in August secondary to abdominal pain.  She was found to have a  spontaneous left renal artery dissection.  Of note, she has a history of  a traumatic pseudoaneurysm of the left internal carotid artery and right  internal carotid artery.  She was last seen by Dr. Madilyn Fireman in February for  routine follow-up.  At that time a CT angiogram revealed stable  pseudoaneurysm in the distal left internal carotid artery measuring 1.4-  mm.  She underwent a full workup while in the hospital including  rheumatology workup, nothing was positive.  She does have a history of  several miscarriages.  She was readmitted to the hospital for abdominal  pain.  She has had a total of 3 CT scans of her belly, which have not  changed.  I am managing her on Plavix at this time.  She comes back in  today for follow-up.  She states that she does have pain at the end of  the day requiring narcotics.  She is not currently working.  She does  feel like that she is overall improving.   At this time I am a little concerned about the total radiation dose that  the patient is getting and, therefore, I think our next follow-up study  will be an MRA both of her carotids as well as her abdomen.  Hopefully,  this will give [place] the resolution that we need to further manage this  problem for her; if not, I would have to repeat her CAT scan.  I have  told her that as long as she continues to have abdominal pain, even  though it is mild, that she should not proceed with any strenuous  activity.  I have scheduled her to come back to see me in 3 months.  I  have given her 50 oxycodone to help with her pain as well as a work  excuse until her pain  resolves.  Again, I will see her back in 3 months.   Jorge Ny, MD  Electronically Signed   VWB/MEDQ  D:  07/23/2009  T:  07/24/2009  Job:  [id number]
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
## 2011-03-25 NOTE — H&P (Signed)
NAMEGEORGENE, KOPPER               ACCOUNT NO.:  [id number]   MEDICAL RECORD NO.:  [id number]          PATIENT TYPE:  INP   LOCATION:  1823                         FACILITY:  [facility]   PHYSICIAN:  Gordy Savers, MDDATE OF BIRTH:  16-May-1971   DATE OF ADMISSION:  06/22/2009  DATE OF DISCHARGE:                              HISTORY & PHYSICAL   CHIEF COMPLAINT:  Abdominal pain and nausea.   HISTORY OF PRESENT ILLNESS:  The patient is a 40 year old female who had  the onset of left lower quadrant and left flank pain approximately 5  days prior to admission.  The pain was intermittent and associated with  anorexia and some nausea.  Two days prior to admission, the patient  presented to the ED for evaluation due to worsening pain, nausea, and  episodic vomiting.  Evaluation at that time revealed a wedge-shaped area  of diminished attenuation in the upper pole of the left kidney.  This  abnormal enhancement in the left kidney was thought to be perhaps  related to pyelonephritis.  A urinalysis was negative.  The patient was  felt to perhaps have pain related to constipation issues and the patient  was treated with a MiraLax bowel prep.  She returned to the ED on the  day of admission after a very successful bowel prep, but having  persistent left lower abdominal and left flank discomfort.  There was  some radiation of the pain to the left groin area.  She has had episodic  pain described as a severe cramping associated with anorexia, nausea,  and occasional emesis.  On further radiology review, it was felt that  the abnormal enhancement of the left kidney was most likely related to a  left renal infarct.  The patient is now admitted for further evaluation  and treatment.   PAST MEDICAL HISTORY:  The patient has a history of a bilateral  traumatic carotid artery dissection with pseudoaneurysm formation.  She  has been followed closely by Dr. Madilyn Fireman.  This was in February 2008.  She  has  treated hypertension.  She is a gravida 6, para 3, abortus 3 with a  history of 3 miscarriages and also a history of postpartum hemorrhage  resulting in severe anemia, requiring blood transfusions.  She has had  D&Cs and a C-section in the past.   SOCIAL HISTORY:  She is married.  Nondrinker and nonsmoker.   FAMILY HISTORY:  Fairly noncontributory.  Father aged 17 and mother  approximately aged 84, both with hypertension.  One sister with  hypertension.  Two brothers are well.  There is no family history of any  clotting disorders or bleeding diathesis.  Paternal grandfather did have  a stroke in his early 57s.   REVIEW OF SYSTEMS:  Otherwise unremarkable except as mentioned in the  history of present illness.   PHYSICAL EXAMINATION:  GENERAL:  Revealed a well-developed, healthy-  appearing female presently in no acute distress.  VITAL SIGNS:  Blood pressure 130/70, pulse rate 90, respiratory rate 16,  and O2 saturation 99%.  SKIN:  Warm and dry without rash.  HEAD AND NECK:  Revealed normal pupil responses.  Conjunctivae are  clear.  Tympanic membranes and oropharynx benign.  Examination of the  neck revealed a normal carotid upstroke.  No bruits appreciated.  There  is no adenopathy or neck vein distention.  CHEST:  Clear.  CARDIOVASCULAR:  Revealed normal S1 and S2.  ABDOMEN:  Revealed considerable left flank tenderness.  There is also  mild tenderness in the left lower quadrant.  Bowel sounds are active.  No guarding, no rebound.  EXTREMITIES:  Revealed no edema.  No signs of phlebitis.  Pedal pulses  were full.   IMPRESSION:  Left renal infarct, rule out antiphospholipid syndrome,  rule out other coagulopathies in view of her history of traumatic  carotid artery dissection.  Consider fibromuscular dysplasia.   ADDITIONAL DIAGNOSIS:  Hypertension.   DISPOSITION:  We will order a hypercoagulable workup with special  attention to exclude antiphospholipid syndrome.  If  hypercoagulable  state is diagnosed, we will consider IV heparin and possible Coumadin  anticoagulation.  The patient was treated symptomatically with  analgesics and antiemetics.  She will be treated with IV fluids, and  serial renal function studies will be monitored.      Gordy Savers, MD  Electronically Signed     PFK/MEDQ  D:  06/23/2009  T:  06/23/2009  Job:  [id number]

## 2011-03-25 NOTE — Assessment & Plan Note (Signed)
OFFICE VISIT   Brittany Gibson, Brittany Gibson  DOB:  02-24-1971                                       02/17/2008  EAVWU#:[id number]   Patient returns today for continued followup of distal left internal  carotid artery dissection and with pseudoaneurysm formation and a right  internal carotid artery dissection.   She underwent CT angiogram prior to this visit.  This does verify the  presence of an area of injury with pseudoaneurysm formation of the  distal left internal carotid artery at the skull base.  There may be  changes of some fibromuscular dysplasia present within the artery.  The  right internal carotid artery reveals changes consistent with a healed  dissection.   Patient has continued to have a few headaches and occasionally some  numbness and tingling in the right arm.  She notes some discomfort in  the left side of her neck.  No visual disturbance.  No motor deficit.  No speech problems.   PHYSICAL EXAMINATION:  Blood pressure is 135/86, pulse 77 per minute,  respirations 16 per minute.  No carotid bruits.  Cranial nerves are  intact.  Strength equal bilaterally.  Normal reflexes.   Findings are consistent with a healed right internal carotid artery  dissection and continued presence of a traumatic pseudoaneurysm over the  left internal carotid artery at the skull base.  I am not comfortable  sending patient back to work at this time and would like to see further  followup prior to making that commitment.  Therefore, will plan to see  her in August with a repeat CT angiogram of the neck at that time.   Balinda Quails, M.D.  Electronically Signed   PGH/MEDQ  D:  02/17/2008  T:  02/17/2008  Job:  [id number]   cc:   Donalee Citrin, M.D.  Duncan Dull, M.D.

## 2011-03-26 ENCOUNTER — Other Ambulatory Visit: Payer: Self-pay | Admitting: Surgery

## 2011-03-26 DIAGNOSIS — I7773 Dissection of renal artery: Secondary | ICD-10-CM

## 2011-03-26 DIAGNOSIS — Q272 Other congenital malformations of renal artery: Secondary | ICD-10-CM

## 2011-03-26 DIAGNOSIS — I720 Aneurysm of carotid artery: Secondary | ICD-10-CM

## 2011-03-28 NOTE — Discharge Summary (Signed)
[facility]  Patient:    Brittany Gibson, [name] Attending:  Conley Simmonds A                           Discharge Summary  ADMISSION DIAGNOSES:          1. Postpartum hemorrhage.                               2. Retained products of conception.  DISCHARGE DIAGNOSES:          1. Postpartum henries.                               2. Status post dilation and curettage under                                  ultrasound guidance on April 19, 2000.                               3. Status post transfusion of two units of                                  packed red blood cells.  SIGNIFICANT OPERATIONS AND PROCEDURES:               Dilation and curettage ultrasound guidance on April 19, 2000 by Dr. Conley Simmonds.  PERTINENT HISTORY AND PHYSICAL EXAMINATION UPON ADMISSION:               The patient is a 40 year old gravida 4, para 2-0-2-2 Caucasian female status post normal spontaneous vaginal delivery of a viable female infant on Mar 26, 2000, who presented to the emergency department on April 19, 2000 noting significant vaginal hemorrhage at home and left lower quadrant cramping.  The patient has an unremarkable spontaneous vaginal delivery and third stage of labor.  The patients postpartum course was remarkable for slightly elevated blood pressure which was attributed to pregnancy induced hypertension, which was the indication for induction of her labor.  She was not on any medical therapy.  Physical examination demonstrated the abdomen to be soft and nontender and without masses.  Speculum exam was performed after the patient passed three dinner plate sized clots.  The cervix and vagina were without lesions.  There was no evidence of any active bleeding from the cervix, and the cervix was dilated to 1 cm.  Bimanual exam demonstrated a 10-12 week sized, slightly tender, mobile uterus.  No  adnexal masses were appreciated.  The admission white blood cell count was 10.7 and the hematocrit was 41.2%.  The patient underwent pelvic ultrasound which documented retained products of conception demonstrated by a thickened endometrium measuring 2.4 cm with areas of hypo and hyperechodenisities.  HOSPITAL COURSE:  The patient was admitted with the diagnosis of retained products of conception.  An IV was started and the patient was typed and crossed for two units of packed red blood cells.  The patient was consented for a  dilation and curettage procedure, which was performed under MAC anesthesia.  The surgery was significant for an estimated blood loss of 1500, which was attributed to a large volume of retained products of conception and uterine atony.  The patient did receive Pitocin 40 IV/L x 2 L and Hemabate 250 mcg IM x 2 in order to achieve adequate contraction of the uterus.  An intraoperative hemoglobin was noted to be 11. 0.  Postoperatively, the patient had intermittent hypotension and tachycardia. She was fluid resuscitated with plasma protein fraction 5%, Hespan and, ultimately, two units of packed red blood cells for a hemoglobin of 6.5 which was measured at 1600 on April 19, 2000.  The patient had an appropriate rise in her hemoglobin to a level of 25.3 following the transfusion, and her vital signs remained stable after this time.  The patient did demonstrate low grade fevers prior to her transfusion.  Flagyl and doxycycline intravenous therapy was initiated.  By postoperative day #1, the patient was able to ambulate independently, take a regular diet, and void spontaneously.  She was noted to be stable for discharge on April 20, 2000.  DISCHARGE STATUS:             Stable.  The patient will be discharged to home. The patient will receive prescriptions for the following medications: Percocet, 1-2 p.o. q.4-6h. p.r.n. pain; Motrin 600 mg p.o. q.6h. p.r.n. pain; Premarin  1.25 mg p.o. q.d. on days #1 through #25; Provera 10 mg, one p.o. q.d. on days #16 through #25; doxycycline 100 mg p.o. b.i.d. x 13 days; Flagyl 500 mg p.o. b.i.d. x 13 days; iron sulfate 325 mg p.o. t.i.d. and Colace 100 mg p.o. b.i.d. p.r.n. constipation.  The patient will have decreased activity over the next two weeks.  She will return to the office on April 24, 2000.  She will call before that time if she experienced problems with fever, vaginal bleeding greater than one pad per hour, abdominal pain, dizziness or any other concern. DD:  04/20/00 TD:  04/22/00 Job: [id number]

## 2011-03-28 NOTE — Discharge Summary (Signed)
   NAME:  Brittany Gibson, Brittany Gibson                         ACCOUNT NO.:  [id number]   MEDICAL RECORD NO.:  [id number]                   PATIENT TYPE:  INP   LOCATION:  9151                                 FACILITY:  [facility]   PHYSICIAN:  Gerrit Friends. Aldona Bar, M.D.                DATE OF BIRTH:  1971/03/12   DATE OF ADMISSION:  08/19/2002  DATE OF DISCHARGE:  08/21/2002                                 DISCHARGE SUMMARY   DISCHARGE DIAGNOSES:  1. Gestation at 31-32 weeks, undelivered.  2. Gestational hypertension.  3. Preterm contractions.   SUMMARY:  This 40 year old gravida 5 para 2 with a last menstrual period of  December 31, 2001 and a due date of October 19, 2002 presented to the  office with hypertension, swelling, and a mild headache.  She was found to  have a blood pressure of approximately 158/100 with 1+ proteinuria.  She had  a history of pregnancy induced hypertension with other pregnancies.  She was  sent to the hospital for evaluation.  Her blood pressure remained up.  She  was started on 20 mg of Procardia q.8h.  Subsequently, all of her PIH labs  were normal and she was placed on magnesium sulfate.  The Procardia was  discontinued, betamethasone was administered per protocol and she was  observed.  On the morning of October 11, as mentioned, all of her labs were  normal and her blood pressure on October 11 was within normal limits.  She  was having rare contractions.  The plan was to discontinue her magnesium  sulfate, reinstitute her Procardia, continue with betamethasone course, and  if stable discharge her on the morning of October 12 - which is indeed what  has occurred.  On the morning of October 12 her blood pressure was normal,  she was having minimal to no contractions, she was having no headache, her  blood pressure was normal, and she was discharged to home with explicit  instructions, with return to the office in approximately three to four days  time.   DISCHARGE  MEDICATIONS:  1. Vitamins - one a day.  2. Procardia 20 mg q.8h. to prevent contractions.   HOSPITAL LABORTORY DATA:  Hemoglobin 11.6; white count 11.4; platelet count  271,000.  Comprehensive metabolic profile was normal.  Uric acid 4.0.  LDH  133.  Negative urinalysis - no protein was noted.   CONDITION ON DISCHARGE:  Improved.                                               Gerrit Friends. Aldona Bar, M.D.    RMW/MEDQ  D:  08/21/2002  T:  08/22/2002  Job:  [id number]

## 2011-03-28 NOTE — Op Note (Signed)
[name], [name] Attending:  Conley Simmonds A                           Operative Report  PREOPERATIVE DIAGNOSIS:       Retained products of conception, postpartum hemorrhage.  POSTOPERATIVE DIAGNOSIS:      Retained products of conception with postpartum hemorrhage, uterine atony.  OPERATION:                    Examination under anesthesia, ultrasound guided dilation and curettage.  SURGEON:                      Brittany Gibson, M.D.  ANESTHESIA:                   MAC, paracervical block.  IV FLUIDS:                    2000 cc of crystalloid, 50 cc plasma protein fraction 5%.  ESTIMATED BLOOD LOSS FROM SURGERY:            1500 cc.  URINE OUTPUT:                 100 cc.  SPECIMENS:                    Large amount of clot and products of conception were sent to pathology.  INDICATIONS FOR PROCEDURE:    The patient is a 40 year old gravida 4, para 2-0-2-2 Caucasian female, status post normal spontaneous vaginal delivery of a viable female infant on Mar 26, 2000, who presented to the emergency department on April 19, 2000 with the chief complaint of a large volume of vaginal hemorrhage this morning.  The patient reported blood active pouring from the vagina as well as left lower quadrant cramping.  Upon arrival to the emergency department, the patient passed three large clots measuring approximately 8-10 inches in diameter, which was noted by the nursing staff.  The patient had an uncomplicated vaginal delivery as well as third stage of labor, with spontaneous delivery of an intact placenta five minutes after delivery of the newborn.  The patients estimated blood loss at delivery was less than 500 cc and her postpartum course was remarkable only for some elevated blood pressures, which was the indication for her induction.  The patient did  not have any evidence of toxemia and she was not treated with antihypertensives.  Physical examination in the ER documented the uterine size to be between 10 and 12 weeks size.  There was a small, 1 cm clot at the external os, which was dilated 1 cm.  There was no active bleeding noted.  No adnexal masses or tenderness were appreciated.  The patient was afebrile and there was evidence of orthostasis.  Hematocrit was 41.2%.  The patient did have a pelvic ultrasound which documented an endometrial stripe measuring 2.4 cm, consistent with retained products of conception.  A discussion was held with the patient regarding the need for a dilation and curettage procedure in order to remove the retained products and control bleeding.  The patient agreed to the procedure after the risks and benefits were reviewed.  The patient  was treated with cefotetan 2 g IV preoperatively.  A Foley catheter was previously placed inside the urinary bladder.  FINDINGS:                     Examination under anesthesia revealed a 14 week sized uterus with no adnexal masses.  There was a moderate amount of products of conception noted in the uterine cavity.  There was significant intraoperative blood loss, which was treated with Pitocin 40 units per L x 2 L total.  The patient also received Hemabate 250 mcg IM x 2 injections.  For further resuscitation, the patient did receive plasma protein fraction 5% 50 cc intraoperatively.  At the end of the procedure, hemostasis was noted to be excellent, uterine size was eight weeks and well contracted.  DESCRIPTION OF PROCEDURE:     The patient was transported to the operating room after being properly identified.  The patient was placed in the supine position and MAC anesthesia was induced.  The patient was then placed in the dorsal lithotomy position.  The vagina and perineum were sterilely prepped and draped.  A speculum was placed inside the vagina and a single tooth  tenaculum was placed on the anterior cervical lip.  The uterine cavity was curetted with both sharp and serrated curets.  Intraoperative ultrasound was used to guide the procedure and this procedure was continued in a similar fashion until the endometrial stripe was noted to be ______ and easily visible from the lower uterine segment all the way to the level of the fundus.  To obtain adequate uterine contraction, the patient did receive Pitocin 40 units IV/L x 2 L and Hemabate 250 mcg IM x 2.  A ring forceps was used to extract any remaining tissue from within the uterine cavity.  At the end of the procedure, hemostasis was excellent, the uterus was noted to be well contracted and eight week sized.  The single tooth tenaculum was taken off the anterior cervical lip.  The cervix and vagina were observed for any evidence of ongoing bleeding.  There was none.  The instruments were taken out of the vagina and the patients perineum was cleansed.  She was taken out of the dorsal lithotomy position.  The patient did demonstrate some evidence of hypertension immediately following the procedure, which was treated postoperatively with an additional 50 cc of plasma protein fraction 5%.  The patient did respond and was stable and awake at the time of her transfer to the recovery room.  All lap, needle and instrument counts were correct.  Intraoperative hemoglobin was 11%. DD:  04/19/00 TD:  04/21/00 Job: [id number]

## 2011-03-28 NOTE — Op Note (Signed)
NAME:  Brittany Gibson, Brittany Gibson                         ACCOUNT NO.:  [id number]   MEDICAL RECORD NO.:  [id number]                   PATIENT TYPE:  INP   LOCATION:  9137                                 FACILITY:  [facility]   PHYSICIAN:  Gerrit Friends. Aldona Bar, M.D.                DATE OF BIRTH:  May 19, 1971   DATE OF PROCEDURE:  09/27/2002  DATE OF DISCHARGE:                                 OPERATIVE REPORT   PREOPERATIVE DIAGNOSES:  1. A 36- to 37-week intrauterine pregnancy.  2. Mild preeclampsia.  3. Failed induction attempt.  4. Desire for permanent elective sterilization.   POSTOPERATIVE DIAGNOSES:  1. A 36- to 37-week intrauterine pregnancy.  2. Mild preeclampsia.  3. Failed induction attempt.  4. Desire for permanent elective sterilization.  5. Delivery of 6 pound 9 ounce female infant, Apgars 9 and 9.  6. Back-down, transverse lie.  7. Uterine inversion.  8. Likely placenta increta.  9. Pathology pending on segment of each fallopian tube.   PROCEDURES:  1. Primary low-transverse cesarean section.  2. Tubal sterilization.   SUMMARY:  This 40 year old, gravida 5, para 2 with a due date of December 10  was admitted to the hospital on November 17 with mild preeclampsia and a  breech presentation.  She had two previous vaginal deliveries.  An attempt  of aversion was discussed and carried out.  Induction was begun on the  evening of November 17 with Cytotec.  On the morning of November 18, the  cervix was fingertip (external os) with a thick cervix and a closed internal  os and floating presentation, which was verified in the labor room by  ultrasound to be a vertex.  Discussion was carried out with the patient as  to continuing induction versus primary cesarean section.  After much  discussion, a decision was made to proceed with a primary low-transverse  cesarean section and a tubal sterilization procedure.  The patient was taken  to the operating room for such procedure.   DESCRIPTION OF  PROCEDURE:  Once in the operating suite, a spinal anesthetic  was applied by Dr. Arby Barrette and the patient was prepped and draped in the  usual fashion and a Foley catheter was inserted as part of the prep.  After good documentation of anesthetic levels and after the patient was  appropriately draped, the procedure was begun.  A Pfannenstiel incision was  made with minimal difficulty dissecting down sharply to and through the  fascia in a low-transverse fashion with hemostasis created at each layer.  A  subfascial space was created inferiorly and superiorly.  The peritoneum was  identified and entered appropriately with care taken to avoid the bowel  superiorly and the bladder inferiorly.  At this time, the vesicouterine  peritoneum was incised in a low-transverse fashion and pushed off the lower  uterine segment with ease.  Sharp incision through a very thick lower  segment  was then carried out.  The uterine cavity was entered.  It seemed at  this moment that the infant converted to a back-down, transverse lie.  The  uterine incision was extended.  An attempt was made to convert the patient  to a breech and deliver as a breech, but this was unsuccessful.  Keeping the  head flexed, it was possible to convert the patient back to a vertex  presentation even though the procedure was complicated by the baby's left  arm, which had delivered.  Nonetheless, it was possible to convert the baby  back to a vertex and deliver as a vertex.  Once the cord was clamped and  cut, the infant was passed off to the awaiting team and subsequently taken  to the nursery in good condition.  The weight was noted to be 6 pounds and 9  ounces.  Apgars were noted to be 9 and 9.  After the cord blood was delivered intact, a posterior placenta was  attempted to be delivered.  The placenta was very densely adhered to the  posterior surface of the uterus and in fact during the delivery of the  placenta, the uterus  actually totally inverted.  It seemed as if parts of  the placenta were actually a placenta increta and this was as best as  possible removed.  The posterior wall of the uterus was rendered as free of  placental fragments as possible.  Fairly good uterine contractility was  afforded with intravenous Pitocin and manual stimulation.  The lower segment  of the uterus below the incision again was very thick and using a ringed  forceps clamp, the cervix was noted to be open, although, the lower segment  itself appeared to be almost pipe-like in its configuration.  At this time,  with meticulous care, closure of the uterine incision was carried out using  #1 Vicryl first in a running locking fashion and thereafter interrupted  figure-of-eight fashion until adequate hemostasis was carried out.  During  this entire process, the uterus was contracting well, although the patient  was noted to be somewhat hemodynamically unstable secondary to the large  amount of blood loss, which was estimated to be approximately 2000 cc.  Urine remained clear during the entire procedure and ultimately, the uterine  incision was closed and noted to be hemostatic.  The uterus was fairly well  contracted and the patient's vital signs were responding well to fluid  boluses and in fact a second IV was started by anesthesia.  At this time, attention was turned to each fallopian tube.  The fallopian  tube was grasped in the mid portion, elevated and a free tie of #1 plain  catgut suture tied down about the loop of tube and the loop of tube was  excised and sent to pathology.  This was carried out bilaterally.  Both  ovaries appeared normal.  Both tubal ligation sites appeared hemostatic as  did the uterine incision.  At this time, the abdomen was lavaged of all free  blood and clot, the uterus replaced back into the abdominal incision and after all counts were noted to be correct, closure of the abdomen was begun  in  layers.  The abdominal peritoneum was closed with zero Vicryl in a  running fashion.  The peritoneum was closed after all counts were noted to  be correct and no foreign bodies were noted to be remaining in the abdominal  cavity. Closure of the peritoneum was carried out  in the usual fashion with  care taken to avoid the bowel superiorly and the bladder inferiorly.  At this time, subfascial space was rendered hemostatic and the fascia was  reapproximated with zero Vicryl from angle to midline bilaterally.  Subcu  tissue was then rendered hemostatic and staples then used to close the skin.  A sterile pressure dressing was applied and the patient was transported to  the recovery room in satisfactory condition having tolerated the procedure  well.  Her hemodynamic status will be watched carefully in the recovery area  and thereafter.  As mentioned, upon completion of the procedure, the uterus  was well-contracted and vital signs were noted to be approaching more  satisfactory levels.   ESTIMATED BLOOD LOSS:  2000 cc   COUNTS:  All counts correct x 2.   SUMMARY:  This patient underwent a primary low-transverse cesarean section  and tubal sterilization procedure after a failed induction after she was  admitted at 68 to [redacted] weeks gestation for mild preeclampsia.  She was noted  after delivery of the baby, which turned out to be a back-down, transverse  lie to have what appeared to be a partial placenta increta and a uterine  inversion was carried out upon delivery of the placenta.  Upon completion of the procedure, both mother and baby were doing well in  their respective recovery areas.                                               Gerrit Friends. Aldona Bar, M.D.    RMW/MEDQ  D:  09/27/2002  T:  09/27/2002  Job:  [id number]

## 2011-03-28 NOTE — Op Note (Signed)
[facility]  Patient:    Brittany Gibson, Brittany Gibson                      MRN: [id number] Adm. Date:  [id number] Attending:  Conley Simmonds A                           Operative Report  PREOPERATIVE DIAGNOSIS:       Retained products of conception, postpartum hemorrhage.  POSTOPERATIVE DIAGNOSIS:      Retained products of conception, postpartum hemorrhage, uterine atony.  PROCEDURE:                    Examination under anesthesia, ultrasound guided dilation and curettage.  SURGEON:                      Brook A. Edward Jolly, M.D.  ANESTHESIA:                   MAC, paracervical block.  IV FLUIDS:                    2000 cc crystalloid, 50 cc plasma protein fraction 5%.  ESTIMATED BLOOD LOSS:         1500 cc.  URINE OUTPUT:                 150 cc.  SPECIMEN:                     A large amount of clot and products of conception were sent to pathology.  INDICATIONS FOR PROCEDURE:    The patient was a 40 year old gravida 4, para 2-0-2-2 Caucasian female status post normal spontaneous vaginal delivery of a viable female infant on Mar 26, 2000 who presented to the emergency department on April 19, 2000 with a chief complaint of a large volume of vaginal hemorrhage this a.m.  The patient reported blood actively pouring from the vagina as well as left lower quadrant cramping.  Upon arrival to the emergency department the patient passed three large clots measuring approximately 8-10 inches in diameter which was noted by the nursing staff.                                The patient had an uncomplicated vaginal delivery as well as third stage of labor with spontaneous delivery of an intact placenta five minutes after delivery of the newborn.  The patients estimated blood loss at delivery was less than 500 cc and her postpartum course was remarkable only for some elevated blood pressure which was the indication for her induction.  The patient did not have evidence of  toxemia and she was not treated with antihypertensives.                                Physical examination in the ER documented the uterine size to be in between 10-12 week size.  There was a small 1 cm clot at the external os which was dilated 1 cm.  There was no active bleeding noted. No adnexal masses nor tenderness were appreciated.  The patient was afebrile. There was evidence of orthostasis.  A hematocrit was 41.2%.  The patient did have a pelvic ultrasound which documented an endometrial stripe measuring 2.4 cm consistent  with retained products of conception.  A discussion was held with the patient regarding the need for a dilation and curettage procedure in order to remove the retained products and control bleeding.  The patient agreed to the procedure after the risks and benefits were reviewed.  The patient was treated with cefotetan 2 g IV preop.  The Foley catheter was previously placed inside the urinary bladder.  FINDINGS:                     Examination under anesthesia revealed a 14 week sized uterus with no adnexal masses.  There was a moderate amount of products of conception noted in the uterine cavity.  There was significant intraoperative blood loss which was treated with Pitocin 40 units per L x 2 L total.  The patient also received Hemabate 250 mcg IM x 2 injections for fluid resuscitation.  The patient did receive plasma protein fraction 5% 50 cc intraoperatively.  At the end of the procedure hemostasis was noted to be excellent.  The uterine size was eight weeks and well contracted.  PROCEDURE:                    The patient was escorted to the operating room after she was properly identified.  Patient was placed in the supine position and MAC anesthesia was induced.  The patient was then placed in the dorsal lithotomy position.  The vagina and perineum were sterilely prepped and draped.  A speculum was placed inside the vagina and a single tooth tenaculum was  placed on the anterior cervical lip.  The uterine cavity was curetted with both sharp and serrated curette.  Intraoperative ultrasound was used to guide the procedure and this procedure was continued in a similar fashion until the endometrial stripe was noted to be thin and reasonably visible from the lower uterine segment all the way to the level of the fundus.  To obtain adequate uterine contraction the patient did receive Pitocin 40 units IV per L x 2 L and Hemabate 250 mcg IM x 2.  A ring forceps   ** got kicked off system *** DD:  04/20/00 TD:  04/20/00 Job: [id number]

## 2011-03-28 NOTE — Discharge Summary (Signed)
NAME:  Brittany Gibson, Brittany Gibson                         ACCOUNT NO.:  [id number]   MEDICAL RECORD NO.:  [id number]                   PATIENT TYPE:  INP   LOCATION:  9137                                 FACILITY:  [facility]   PHYSICIAN:  Ilda Mori, M.D.                DATE OF BIRTH:  1971/02/12   DATE OF ADMISSION:  09/26/2002  DATE OF DISCHARGE:  09/30/2002                                 DISCHARGE SUMMARY   FINAL DIAGNOSES:  1. Intrauterine pregnancy at 36+ weeks gestation.  2. Mild preeclampsia.  3. Failed induction attempt.  4. Desires for permanent sterilization.  5. Uterine inversion likely placenta accreta, postpartum hemorrhage.  6. Postoperative anemia.   HISTORY OF PRESENT ILLNESS:  This 40 year old G5, P2 was admitted to the  hospital on September 26, 2002, with mild preeclampsia and a breech  presentation. The patient's last two pregnancies ended in vaginal delivery.  An attempt at version was discussed and carried out.  Induction was begun on  the evening of September 26, 2002, with Cytotec.  By September 27, 2002, the  patient's cervix was fingertip, closed, with a floating presentation.  At  this discussion, a decision was made to proceed with a primary low  transverse cesarean section and tubal sterilization procedure.  Up to this,  the patient's antepartum course had only been complicated by a history of  postpartum hemorrhage with her last pregnancy and a transfusion and a  history of pregnancy induced blood pressure so the patient was taken to the  operating room on September 27, 2002, by Dr. Annamaria Helling where a primary low  transverse cesarean section was performed with delivery of a 6 pound 9 ounce  female infant with Apgars of 9 and 9.  After delivery the placenta was  densely adhered to the posterior surface of the uterus and the uterus  actually was totally inverted.  All of this was removed as best as possible  and care was taken to close the uterine incision.  At this  point a bilateral  tubal ligation was performed without complications.  The patient tolerated  the procedure well.  Her postoperative course was complicated by some  shortness of breath, dizziness.  Her postoperative hemoglobin dropped to 6.3  and then 6.4 on postoperative day #2.  The patient did have a history of  postpartum hemorrhage with her second pregnancy.  As this point, discussion  was held with the patient regarding her symptoms and transfusion.  The  patient discussed with the father of the baby and they decided to forego a  transfusion.  By postoperative day #3 the patient was feeling fine and her  blood pressures were improving.  She was felt ready for discharge.   DIET:  She was sent home on a regular diet.   ACTIVITY:  She was told to decrease activity.   DISCHARGE MEDICATIONS:  She was told to continue prenatal  vitamins, FESO4.  She was given a prescription for Tylox one to two every four hours as needed  for pain.   FOLLOW UP:  She was told to follow up in the office in one week for  hemoglobin and blood pressure check.  The patient was also to call with any  increased bleeding or any problems.     [name], P.A.-C.                Ilda Mori, M.D.   MB/MEDQ  D:  11/07/2002  T:  11/07/2002  Job:  [id number]

## 2011-03-28 NOTE — Op Note (Signed)
[facility]  Patient:    Brittany Gibson, Brittany Gibson                        MRN: [id number] Proc. Date: 09/24/00 Attending:  Janeece Riggers. Dareen Piano, M.D.                           Operative Report  PREOPERATIVE DIAGNOSIS:       Ashermans syndrome.  POSTOPERATIVE DIAGNOSIS:      Cervical stenosis.  OPERATION:                    1. Cervical dilation.                               2. Hysteroscopy.  SURGEON:                      Mark E. Dareen Piano, M.D.  ANESTHESIA:                   General.  ANTIBIOTICS:                  Ancef 1 g.  DRAINS:                       Red rubber catheter, bladder.  ESTIMATED BLOOD LOSS:         Minimal.  COMPLICATIONS:                None.  SPECIMENS:                    None.  DESCRIPTION OF PROCEDURE:     The patient was taken to the operating room where she was placed in the dorsal supine position.  A general anesthetic was administered without complications.  She was then placed in the dorsal lithotomy position and prepped with Hibiclens.  She was draped in the usual fashion for a D&C.  A sterile speculum was placed in the vagina.  A single-tooth tenaculum was applied to the anterior cervical lip.  An attempt was made to sound the cervical os; it was obviously stenotic and the sound could not be passed.  At this point, the dilators were used to perforate the stenosis.  While this was being done an obvious stenotic band was ruptured and from this point the cervix dilated easily.  The cervix was dilated to a 29 [place].  At this point the hysteroscope was setup and passed into the uterine cavity.  At the fundus there were a few small bands of scar tissue which were transected, otherwise both ostia were visualized and appeared to be normal. At this point the procedure was concluded.  The hysteroscope was removed and a 8 [place] pediatric Foley was placed into the uterine cavity and the bulb blew up.  This will remain in the uterine  cavity for seven days.  The patient will be discharged to home with Premarin 2.5 mg p.o. q.d. and Keflex 500 mg q.i.d. for one week.  The Foley catheter will be removed in one week. DD:  09/24/00 TD:  09/24/00 Job: [id number]

## 2011-05-30 ENCOUNTER — Encounter: Payer: Self-pay | Admitting: Surgery

## 2011-06-13 ENCOUNTER — Ambulatory Visit
Admission: RE | Admit: 2011-06-13 | Discharge: 2011-06-13 | Disposition: A | Discharge status: Home / Self Care | Payer: BC Managed Care – PPO | Source: Ambulatory Visit | Attending: Surgery | Admitting: Surgery

## 2011-06-13 ENCOUNTER — Other Ambulatory Visit: Payer: Self-pay

## 2011-06-13 DIAGNOSIS — Q272 Other congenital malformations of renal artery: Secondary | ICD-10-CM

## 2011-06-13 DIAGNOSIS — I7773 Dissection of renal artery: Secondary | ICD-10-CM

## 2011-06-13 MED ORDER — GADOBENATE DIMEGLUMINE 529 MG/ML IV SOLN
15.0000 mL | Freq: Once | INTRAVENOUS | Status: AC | PRN
  Administered 2011-06-13: 15 mL via INTRAVENOUS

## 2011-06-16 ENCOUNTER — Ambulatory Visit: Payer: Self-pay | Admitting: Surgery

## 2011-06-23 ENCOUNTER — Ambulatory Visit
Admission: RE | Admit: 2011-06-23 | Discharge: 2011-06-23 | Disposition: A | Discharge status: Home / Self Care | Payer: Worker's Compensation | Source: Ambulatory Visit | Attending: Surgery | Admitting: Surgery

## 2011-06-23 DIAGNOSIS — Q272 Other congenital malformations of renal artery: Secondary | ICD-10-CM

## 2011-06-23 DIAGNOSIS — I720 Aneurysm of carotid artery: Secondary | ICD-10-CM

## 2011-06-23 MED ORDER — GADOBENATE DIMEGLUMINE 529 MG/ML IV SOLN
14.0000 mL | Freq: Once | INTRAVENOUS | Status: AC | PRN
  Administered 2011-06-23: 14 mL via INTRAVENOUS

## 2011-06-30 ENCOUNTER — Encounter: Payer: Self-pay | Admitting: Surgery

## 2011-07-25 ENCOUNTER — Encounter: Payer: Self-pay | Admitting: Surgery

## 2011-07-28 ENCOUNTER — Ambulatory Visit (INDEPENDENT_AMBULATORY_CARE_PROVIDER_SITE_OTHER): Payer: Worker's Compensation | Admitting: Surgery

## 2011-07-28 ENCOUNTER — Encounter: Payer: Self-pay | Admitting: Surgery

## 2011-07-28 VITALS — BP 115/83 | HR 72 | Resp 16 | Wt 140.0 lb

## 2011-07-28 DIAGNOSIS — I720 Aneurysm of carotid artery: Secondary | ICD-10-CM

## 2011-07-28 DIAGNOSIS — I7773 Dissection of renal artery: Secondary | ICD-10-CM
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
## 2011-08-18 ENCOUNTER — Other Ambulatory Visit: Payer: Self-pay | Admitting: Obstetrics and Gynecology

## 2012-01-26 ENCOUNTER — Telehealth: Payer: Self-pay

## 2012-01-26 DIAGNOSIS — I7771 Dissection of carotid artery: Secondary | ICD-10-CM

## 2012-01-26 DIAGNOSIS — R202 Paresthesia of skin: Secondary | ICD-10-CM

## 2012-01-26 DIAGNOSIS — R42 Dizziness and giddiness: Secondary | ICD-10-CM

## 2012-01-26 DIAGNOSIS — R200 Anesthesia of skin: Secondary | ICD-10-CM

## 2012-01-26 NOTE — Telephone Encounter (Signed)
Pt. Called office 01/23/12 with c/o approx 1 mo hx of dizziness, and vision changes.  Also stated difficulty collecting thoughts.  States saw opthalmologist and everything was fine with her eye exam.  Saw PCP and was referred to neurologist, Dr. Maple Hudson.  Was recommended to have MRA of head to evaluate symptoms.  Pt. explained that since initial diagnosis of carotid pseudoaneurysm was r/t to injury, and that it was a workman's comp. Issue, it would have to be precerted through workman's comp.  Stated the neurologist she saw doesn't handle workman's comp cases.  Is requesting that Dr. Myra Gianotti order the MRA of head.  Explained to pt. That Dr. Myra Gianotti cannot order test that was recommended by another MD, and that he would have to evaluate her for her new symptoms.  Pt. cont'd to describe symptoms she has been dealing with.  Stated she has had "numbness/tingling in (R) cheek, temple, and right eye area, and also had increased congestion in sinuses, and a dull, achy headache".  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  Pt. stated she did not want to start Depakote, until further evaluation of head was done.  Advised pt. Nurse would talk to Dr. Myra Gianotti on 3/18, but to go to the ER if symptoms worsened over the weekend.  Verb. Understanding./CP,RN  (25 min. spent speaking with pt. on phone re: above sx's.)   01/26/12 10:30 am; Spoke w/ Dr. Myra Gianotti regarding pt's symptoms as reported on 01/23/12.  Recommended to schedule pt. for appt. with him 02/02/12, and to obtain MRA of head prior to appt. to evaluate dizziness, and numbness/tingling right cheek, temple, and right eye.  Pt. Notified of Dr. Estanislado Spire recommendation.

## 2012-01-26 NOTE — Telephone Encounter (Signed)
Clarification per Dr. Myra Gianotti re: MRA is to schedule MRA of head and neck.

## 2012-01-30 ENCOUNTER — Ambulatory Visit
Admission: RE | Admit: 2012-01-30 | Discharge: 2012-01-30 | Disposition: A | Discharge status: Home / Self Care | Payer: Worker's Compensation | Source: Ambulatory Visit | Attending: Surgery | Admitting: Surgery

## 2012-01-30 ENCOUNTER — Encounter: Payer: Self-pay | Admitting: Surgery

## 2012-01-30 DIAGNOSIS — R200 Anesthesia of skin: Secondary | ICD-10-CM

## 2012-01-30 DIAGNOSIS — I7771 Dissection of carotid artery: Secondary | ICD-10-CM

## 2012-01-30 DIAGNOSIS — R42 Dizziness and giddiness: Secondary | ICD-10-CM

## 2012-01-30 MED ORDER — GADOBENATE DIMEGLUMINE 529 MG/ML IV SOLN
13.0000 mL | Freq: Once | INTRAVENOUS | Status: AC | PRN
  Administered 2012-01-30: 13 mL via INTRAVENOUS

## 2012-02-02 ENCOUNTER — Ambulatory Visit (INDEPENDENT_AMBULATORY_CARE_PROVIDER_SITE_OTHER): Payer: Worker's Compensation | Admitting: Surgery

## 2012-02-02 ENCOUNTER — Encounter: Payer: Self-pay | Admitting: Surgery

## 2012-02-02 VITALS — BP 119/78 | HR 77 | Resp 16 | Ht 61.0 in | Wt 160.0 lb

## 2012-02-02 DIAGNOSIS — I720 Aneurysm of carotid artery: Secondary | ICD-10-CM

## 2012-02-02 DIAGNOSIS — I7773 Dissection of renal artery: Secondary | ICD-10-CM

## 2012-02-02 NOTE — Progress Notes (Signed)
Patient comes in today for an unscheduled visit because of her recent symptoms. 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 She states that in February she began having kaleidoscope type of vision. She had several episodes lasted 45 minutes. She began treatment for migraine headaches. She was also referred to an ophthalmologist to recommend trying glasses. These episodes were associated with headaches. On March 9 2 (2 spells. She also began having trouble with loud noises. Around this time she also developed a right facial numbness from her cheek to her temporal she thought this may be related to A. in her ear infection and began treating this with allegro at the request of her physician more recently she was at church and was trying to read and noticed some blurriness. She also felt nauseous and began shaking at this time she did have some postural dizziness. She has seen a neurologist who has recommended starting her on Depakote however she is been a little rule out and to do this given the side effect profile. I became involved because of her vascular history. I ordered a MRI of her head and neck followup to see if there didn't changes. I have reviewed this study everything appears to be in without significant change. I reassured the patient that I do not feel her traumatic carotid issues are related to her current symptoms. She's going to keep her regular scheduled visit. I spent approximately 30 minutes discussing these findings and reviewing her scans today

## 2012-03-01 ENCOUNTER — Other Ambulatory Visit: Payer: Self-pay | Admitting: *Deleted

## 2012-03-01 DIAGNOSIS — I7773 Dissection of renal artery: Secondary | ICD-10-CM

## 2012-05-20 ENCOUNTER — Ambulatory Visit: Payer: Worker's Compensation

## 2012-08-02 ENCOUNTER — Other Ambulatory Visit: Payer: Worker's Compensation

## 2012-08-02 ENCOUNTER — Ambulatory Visit: Payer: Worker's Compensation | Admitting: Surgery

## 2012-09-20 ENCOUNTER — Other Ambulatory Visit: Payer: Self-pay | Admitting: Orthopedic Surgery

## 2012-09-20 DIAGNOSIS — R531 Weakness: Secondary | ICD-10-CM

## 2012-09-20 DIAGNOSIS — M25511 Pain in right shoulder: Secondary | ICD-10-CM

## 2012-09-22 ENCOUNTER — Other Ambulatory Visit: Payer: Self-pay | Admitting: Orthopedic Surgery

## 2012-09-23 ENCOUNTER — Other Ambulatory Visit: Payer: Worker's Compensation

## 2012-09-23 ENCOUNTER — Other Ambulatory Visit: Payer: Self-pay | Admitting: Orthopedic Surgery

## 2012-09-23 ENCOUNTER — Ambulatory Visit
Admission: RE | Admit: 2012-09-23 | Discharge: 2012-09-23 | Disposition: A | Discharge status: Home / Self Care | Payer: BC Managed Care – PPO | Source: Ambulatory Visit | Attending: Orthopedic Surgery | Admitting: Orthopedic Surgery

## 2012-09-23 DIAGNOSIS — M25512 Pain in left shoulder: Secondary | ICD-10-CM

## 2012-09-23 DIAGNOSIS — R531 Weakness: Secondary | ICD-10-CM

## 2012-09-23 DIAGNOSIS — M25511 Pain in right shoulder: Secondary | ICD-10-CM

## 2012-09-23 MED ORDER — GADOBENATE DIMEGLUMINE 529 MG/ML IV SOLN: 7.0000 mL | Freq: Once | INTRAVENOUS | Status: AC | PRN

## 2012-11-11 ENCOUNTER — Other Ambulatory Visit: Payer: Self-pay | Admitting: *Deleted

## 2012-11-11 DIAGNOSIS — I7770 Dissection of unspecified artery: Secondary | ICD-10-CM

## 2012-11-11 DIAGNOSIS — Z48812 Encounter for surgical aftercare following surgery on the circulatory system: Secondary | ICD-10-CM

## 2013-05-21 ENCOUNTER — Ambulatory Visit (INDEPENDENT_AMBULATORY_CARE_PROVIDER_SITE_OTHER): Payer: BC Managed Care – PPO | Admitting: Family Medicine

## 2013-05-21 ENCOUNTER — Ambulatory Visit: Payer: BC Managed Care – PPO

## 2013-05-21 VITALS — BP 118/78 | HR 94 | Temp 98.0°F | Resp 20 | Ht 61.5 in | Wt 148.8 lb

## 2013-05-21 DIAGNOSIS — R079 Chest pain, unspecified: Secondary | ICD-10-CM

## 2013-05-21 DIAGNOSIS — Z8679 Personal history of other diseases of the circulatory system: Secondary | ICD-10-CM

## 2013-05-21 DIAGNOSIS — R0602 Shortness of breath: Secondary | ICD-10-CM

## 2013-05-21 DIAGNOSIS — R490 Dysphonia: Secondary | ICD-10-CM

## 2013-05-21 DIAGNOSIS — Z91041 Radiographic dye allergy status: Secondary | ICD-10-CM

## 2013-05-21 LAB — BASIC METABOLIC PANEL
CO2: 23 mEq/L (ref 19–32)
Chloride: 105 mEq/L (ref 96–112)
Potassium: 4 mEq/L (ref 3.5–5.3)
Sodium: 137 mEq/L (ref 135–145)

## 2013-05-21 LAB — POCT CBC
Lymph, poc: 2.8 (ref 0.6–3.4)
MCHC: 32.2 g/dL (ref 31.8–35.4)
MID (cbc): 0.7 (ref 0–0.9)
MPV: 8.3 fL (ref 0–99.8)
POC Granulocyte: 4.5 (ref 2–6.9)
POC LYMPH PERCENT: 34.9 %L (ref 10–50)
POC MID %: 8.5 %M (ref 0–12)
RDW, POC: 13.4 %

## 2013-05-21 MED ORDER — AZITHROMYCIN 250 MG PO TABS: ORAL_TABLET | ORAL | Status: DC

## 2013-05-21 MED ORDER — PREDNISONE 50 MG PO TABS: ORAL_TABLET | ORAL | Status: DC

## 2013-05-21 MED ORDER — RANITIDINE HCL 150 MG PO TABS: 150.0000 mg | ORAL_TABLET | Freq: Two times a day (BID) | ORAL | Status: DC

## 2013-05-21 NOTE — Progress Notes (Addendum)
After close of visit AVS printed - patient changed mind about having CT angiogram, however as ordering this - admits to allergy to contrast - had itching in eyes, and swelling of tongue with contrast on prior CT scan, but thinks has had studies afterward tolerated ok. Now has MRI's with contrast for monitoring.  Tightness in chest with phlegm - feels like has to cough to bring up phlegm, and tight/labored to talk - has to work to talk and short of breath after talking, and labored breathing past 3 days - but worse after talking all day. Better in am. But main reason of visit today - chest tightness and feeling.   Will order CT angio of chest to rule out aortic aneurysm/dissection. Contrast allergy - discussed with radiologist - will need prednisone 50mg  at 13 hours, 7 hours, adn 1 hour prior to CT scan, then 50mg  of benadryl at time of scan.  Will also have scan performed at hospital setting. If any increase or worsening symptoms prior to time of scan - will need to go to ER and IV steroid prep for more acute CT may be used. Discussed with patient plan above.  After discussion of above test and plan - noted patient has allergy to prednisone - she states that in past had reaction to prednisone - rash all over legs and arms - ?hives. Has not taken prednisone since that time. Discussed again with radiologist - MRI would be outpatient scheduled into next week, so if looking for aneurysm/dissection - recommended CT.  Unlikely that prednisone would cause hives, but discussed given these symptoms a safer option would be to be seen through the ER for IF steroid option prior to CT scan so she could be monitored for reaction. She declined this option due to cost of eval there as well. Did agree to try outpatient prednisone prep and CT tomorrow am, but if any new reactions - hives, rash, or other reaction - to proceed directly to the emergency room.   Spent over 1 hour face to face and coordinating care with subsequent  face-face time and phone calls.

## 2013-05-21 NOTE — Progress Notes (Signed)
Subjective:    Patient ID: Brittany Gibson, female    DOB: [birth year], 42 y.o.   MRN: [id number]  HPI [name] is a 42 y.o. 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 Takes Bayer aspirin each day, and lisinopril for blood pressure.  Had negative testing for [name] and other testing negative for cause of this.  Vascualr: [place].  Today c/o laryngitis for past 3 months. Initially sneezing/allergy sx's - tried Zyrtec. Followed by Dr.Bland for this last week - for blood pressure check, started on Nasocort - planning on this for 1 month, then to ENT if not improved.   2-3 days ago - more chest congestion, some drainage down throat. Shortness of breath - past 3 days - to walk, talking. Unable to work out 2 days ago.  Pain into back, but chest feels tight - past 3 days. No fever. Worst dizzy spell 3-4 days ago - no chest pain then. Some persistent dizziness, but not as bad as initial episode. Does feel pain into sides of upper/mifddle back - comes and goes.   FH: no known FH of CAD, no FH of aortic aneurysm.  Grandparent with stroke.   Review of Systems  Constitutional: Negative for fever and chills.  HENT: Positive for congestion, sore throat (with congestion. ), voice change and postnasal drip. Negative for ear pain.   Respiratory: Positive for chest tightness and shortness of breath.   Cardiovascular: Positive for chest pain (tightness. ).  Gastrointestinal: Negative for nausea, vomiting, abdominal pain, blood in stool and anal bleeding.  Genitourinary: Negative for menstrual problem (due for menses next few days. ).  Skin: Negative for color change and rash.  Neurological: Positive for dizziness and light-headedness.       Objective:   Physical Exam  Vitals reviewed. Constitutional: She is oriented to person, place, and time. She appears well-developed and well-nourished. No distress.  HENT:  Head: Normocephalic and atraumatic.  Right Ear: Hearing, tympanic membrane, external ear and ear canal normal.  Left Ear: Hearing, tympanic membrane, external ear and ear canal normal.  Nose: Nose normal.  Mouth/Throat: Oropharynx is clear and moist. No oropharyngeal exudate.  Eyes: Conjunctivae and EOM are normal. Pupils are equal, round, and reactive to light.  Cardiovascular: Normal rate, regular rhythm, normal heart sounds and intact distal pulses.   No murmur heard. Pulmonary/Chest: Breath sounds normal. No accessory muscle usage. Tachypnea (at times, but then speaking in full sentences, intermittently taking deep breaths, with coughing spells. ) noted. She has no decreased breath sounds. She has no wheezes. She has no rhonchi. She has no rales.  Abdominal: Normal appearance and bowel sounds are normal. She exhibits no pulsatile midline mass. There is no tenderness. There is no CVA tenderness.  Neurological: She is alert and oriented to person, place, and time. No sensory deficit.  Skin: Skin is warm and dry. No rash noted. She is not diaphoretic.  Psychiatric: Her speech is normal and behavior is normal. Her mood appears anxious (slightly. ).  Hoarse voice.    EKG: sr, no acute findings.   Results for orders placed in visit on 05/21/13  POCT CBC      Result Value Range   WBC 8.0  4.6 - 10.2 K/uL   Lymph, poc 2.8  0.6 - 3.4   POC LYMPH PERCENT 34.9  10 - 50 %L   MID (cbc) 0.7  0 - 0.9   POC MID % 8.5  0 - 12 %M   POC Granulocyte 4.5  2 - 6.9   Granulocyte percent 56.6  37 - 80 %G   RBC 4.57  4.04 - 5.48 M/uL   Hemoglobin 13.6  12.2 - 16.2 g/dL   HCT, POC 21.3  08.6 - 47.9 %   MCV 92.3  80 - 97 fL   MCH, POC 29.8  27 - 31.2 pg   MCHC 32.2  31.8 - 35.4 g/dL   RDW, POC 57.8     Platelet Count, POC 372  142 - 424 K/uL    MPV 8.3  0 - 99.8 fL    [facility] reading (PRIMARY) by  Dr. Neva Seat: CXR: faint increased markings RLL. No apparent mediastinal widening.   Stat overread: Findings: Cardiac and mediastinal contours are normal. Vascularity  is normal. Negative for infiltrate effusion or mass.  IMPRESSION:  Negative  Clinically significant discrepancy from primary report, if  provided: None     Assessment & Plan:  [name] is a 42 y.o. female Hx of arterial aneurysm - Plan: POCT CBC  Shortness of breath - Plan: DG Chest 2 View, EKG 12-Lead  Chest pain - Plan: DG Chest 2 View, EKG 12-Lead  Personal history of other diseases of circulatory system - Plan: POCT CBC   Cough,chest congestion, ntermittent chest tightness, dyspnea and intermittent back pain last 3 days. Suspected viral vs broncitic cause with congestion sx's, underlying laryngitis - AR +-LPR +/-VCD.  will add zantac 150mg  BID for possible LPR, continue flonase, Zpak for possible bronchitis,mucinex or mucinex DM.   Hx of renal artey dissection, carotid aneurysm in past, suspected infectious/congestion cause for above, but with chest tightness, perceived dyspnea (normal pulse ox).  discussed with vascular surgeon on call, recommended CT angio- chest to rule out aortic aneurysm/dissection. Discussed at length with patient - she states the shortness of breath is only with talking, and no back pain in office.  She declined CT angio at this point, but will call back later or in am if she changes her mind.  Advised to call 911 or go to ER if any change or worsening of her symptoms. Also advised of risks of aortic aneurysm or dissection if present. Understanding expressed.   Spent over 30 minutes face to face with hx and discussion of plan

## 2013-05-21 NOTE — Addendum Note (Signed)
Addended by: Meredith Staggers R on: 05/21/2013 02:01 PM   Modules accepted: Orders

## 2013-05-21 NOTE — Addendum Note (Signed)
Addended by: Meredith Staggers R on: 05/21/2013 04:33 PM   Modules accepted: Orders

## 2013-05-21 NOTE — Addendum Note (Signed)
Addended by: Eddie Candle on: 05/21/2013 03:27 PM   Modules accepted: Orders

## 2013-05-21 NOTE — Patient Instructions (Addendum)
Continue nasal spray, but also can take zantac for possible acid reflux cause of laryngitis/hoarse voice.  Would agree with ENT eval in next few weeks if not improving.  Zpak for possible bronchitis, mucinex or Mucinex DM for cough/congestion. If you change your mind about the CT scan of the chest, or especially if any worsening chest pain, shortness of breath or back pain - call 911 or got to the emergency room. Return to the clinic or go to the nearest emergency room if any of your symptoms worsen or new symptoms occur.  Prednisone as discussed prior to CT scan, benadryl at time of scan.  If any new rash, hives or other reaction - go directly to nearest emergency room.   Arrive at [facility] at 8am 05/22/13. Register at radiology for CT

## 2013-05-22 ENCOUNTER — Ambulatory Visit (HOSPITAL_COMMUNITY)
Admission: RE | Admit: 2013-05-22 | Discharge: 2013-05-22 | Disposition: A | Discharge status: Home / Self Care | Payer: BC Managed Care – PPO | Source: Ambulatory Visit | Attending: Family Medicine | Admitting: Family Medicine

## 2013-05-22 DIAGNOSIS — Z8679 Personal history of other diseases of the circulatory system: Secondary | ICD-10-CM

## 2013-05-22 DIAGNOSIS — R0789 Other chest pain: Secondary | ICD-10-CM | POA: Insufficient documentation

## 2013-05-22 DIAGNOSIS — M549 Dorsalgia, unspecified: Secondary | ICD-10-CM | POA: Insufficient documentation

## 2013-05-22 DIAGNOSIS — R079 Chest pain, unspecified: Secondary | ICD-10-CM

## 2013-05-22 DIAGNOSIS — R0602 Shortness of breath: Secondary | ICD-10-CM

## 2013-05-22 MED ORDER — IOHEXOL 350 MG/ML SOLN
80.0000 mL | Freq: Once | INTRAVENOUS | Status: AC | PRN
  Administered 2013-05-22: 80 mL via INTRAVENOUS

## 2013-06-28 ENCOUNTER — Other Ambulatory Visit: Payer: Self-pay | Admitting: *Deleted

## 2013-07-07 ENCOUNTER — Other Ambulatory Visit: Payer: Self-pay | Admitting: *Deleted

## 2013-07-07 DIAGNOSIS — I7771 Dissection of carotid artery: Secondary | ICD-10-CM

## 2013-07-29 ENCOUNTER — Ambulatory Visit
Admission: RE | Admit: 2013-07-29 | Discharge: 2013-07-29 | Disposition: A | Discharge status: Home / Self Care | Payer: Worker's Compensation | Source: Ambulatory Visit | Attending: Surgery | Admitting: Surgery

## 2013-07-29 ENCOUNTER — Encounter: Payer: Self-pay | Admitting: Surgery

## 2013-07-29 DIAGNOSIS — I7773 Dissection of renal artery: Secondary | ICD-10-CM

## 2013-07-29 MED ORDER — GADOBENATE DIMEGLUMINE 529 MG/ML IV SOLN
13.0000 mL | Freq: Once | INTRAVENOUS | Status: AC | PRN
  Administered 2013-07-29: 13 mL via INTRAVENOUS

## 2013-08-01 ENCOUNTER — Ambulatory Visit
Admission: RE | Admit: 2013-08-01 | Discharge: 2013-08-01 | Disposition: A | Discharge status: Home / Self Care | Payer: Worker's Compensation | Source: Ambulatory Visit | Attending: Surgery | Admitting: Surgery

## 2013-08-01 ENCOUNTER — Other Ambulatory Visit: Payer: BC Managed Care – PPO

## 2013-08-01 ENCOUNTER — Encounter: Payer: Self-pay | Admitting: Surgery

## 2013-08-01 ENCOUNTER — Ambulatory Visit (INDEPENDENT_AMBULATORY_CARE_PROVIDER_SITE_OTHER): Payer: BC Managed Care – PPO | Admitting: Surgery

## 2013-08-01 VITALS — BP 117/75 | HR 95 | Resp 16 | Ht 61.0 in | Wt 151.9 lb

## 2013-08-01 DIAGNOSIS — I720 Aneurysm of carotid artery: Secondary | ICD-10-CM

## 2013-08-01 DIAGNOSIS — I7771 Dissection of carotid artery: Secondary | ICD-10-CM

## 2013-08-01 DIAGNOSIS — I7773 Dissection of renal artery: Secondary | ICD-10-CM

## 2013-08-01 MED ORDER — GADOBENATE DIMEGLUMINE 529 MG/ML IV SOLN
12.0000 mL | Freq: Once | INTRAVENOUS | Status: AC | PRN
  Administered 2013-08-01: 12 mL via INTRAVENOUS
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
## 2013-08-02 NOTE — Addendum Note (Signed)
Addended by: Lorin Mercy K on: 08/02/2013 08:00 AM   Modules accepted: Orders

## 2013-08-18 ENCOUNTER — Other Ambulatory Visit: Payer: Self-pay | Admitting: Obstetrics and Gynecology

## 2013-12-07 ENCOUNTER — Ambulatory Visit (INDEPENDENT_AMBULATORY_CARE_PROVIDER_SITE_OTHER): Payer: BC Managed Care – PPO | Admitting: Physician Assistant

## 2013-12-07 VITALS — BP 142/90 | HR 98 | Temp 98.0°F | Resp 16 | Ht 61.5 in | Wt 162.0 lb

## 2013-12-07 DIAGNOSIS — R51 Headache: Secondary | ICD-10-CM

## 2013-12-07 DIAGNOSIS — I10 Essential (primary) hypertension: Secondary | ICD-10-CM

## 2013-12-07 MED ORDER — LISINOPRIL 10 MG PO TABS: 10.0000 mg | ORAL_TABLET | Freq: Every day | ORAL | Status: DC

## 2013-12-07 NOTE — Progress Notes (Signed)
Subjective:    Patient ID: Brittany Gibson, female    DOB: 03/08/1971, 43 y.o.   MRN: [id number]  HPI  Patient presents for rx refill.  Has been out of lisinopril for 1 week.  Has had awful headache that started this morning. Denies lightheadedness, dizziness and visual changes. Not the worst headache of her life. She has not taken anything for her headache.  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  Is planning to establish with new PCP - possibly [facility] but was not able to schedule a new patient appointment with them until March 2015.   Her blood pressure has been very well-controlled in the past with blood pressures ranging from 110-120/70-80. She tolerates lisinopril well.   Last blood work at July 2014 - declines having labs today - she will have them done as soon as she sets up appointment with new PCP.   Review of Systems  Constitutional: Negative for fever and chills.  HENT: Positive for rhinorrhea. Negative for ear pain, postnasal drip and sore throat.   Respiratory: Negative for shortness of breath.   Cardiovascular: Negative for chest pain and palpitations.  Gastrointestinal: Negative for nausea, vomiting, diarrhea, constipation and blood in stool.  Genitourinary: Negative for dysuria and hematuria.  Musculoskeletal: Positive for neck pain (related to HA). Negative for arthralgias.       Objective:   Physical Exam  Constitutional: She is oriented to person, place, and time. She appears well-developed and well-nourished.  HENT:  Head: Normocephalic and atraumatic.  Right Ear: Tympanic membrane, external ear and ear canal normal.  Left Ear: Tympanic membrane, external ear and ear canal normal.  Nose: Rhinorrhea (mild) present. No mucosal edema.  Mouth/Throat: Uvula is midline, oropharynx is clear and moist and mucous membranes are normal.  Eyes: Conjunctivae, EOM and  lids are normal. Pupils are equal, round, and reactive to light.  Fundoscopic exam:      The right eye shows no arteriolar narrowing, no AV nicking, no exudate, no hemorrhage and no papilledema. The right eye shows red reflex.       The left eye shows no arteriolar narrowing, no AV nicking, no exudate, no hemorrhage and no papilledema. The left eye shows red reflex.  Neck: Normal range of motion. Neck supple. No spinous process tenderness and no muscular tenderness present.  Cardiovascular: Normal rate, regular rhythm, normal heart sounds and intact distal pulses.   Pulmonary/Chest: Effort normal and breath sounds normal.  Lymphadenopathy:       Head (right side): No submental, no submandibular, no tonsillar, no preauricular, no posterior auricular and no occipital adenopathy present.       Head (left side): No submental, no submandibular, no tonsillar, no preauricular, no posterior auricular and no occipital adenopathy present.    She has no cervical adenopathy.       Right: No supraclavicular adenopathy present.       Left: No supraclavicular adenopathy present.  Neurological: She is alert and oriented to person, place, and time.  Psychiatric: She has a normal mood and affect. Her behavior is normal. Judgment and thought content normal.        Assessment & Plan:    1. HTN (hypertension) Prescribed 90 day supply of Lisinopril. Pt is to establish with new PCP and have lab work completed with them. Encouraged healthy diet, weight loss and exercise.  -  lisinopril (PRINIVIL,ZESTRIL) 10 MG tablet; Take 1 tablet (10 mg total) by mouth daily.  Dispense: 90 tablet; Refill: 0  2. Headache(784.0) Take acetaminophen and/or ibuprofen when you get home. If headache worsens or does not resolve, return to [facility] or go to ER.

## 2013-12-07 NOTE — Patient Instructions (Signed)
If your headache worsens or persists, please return or go to the emergency department.

## 2013-12-08 ENCOUNTER — Ambulatory Visit: Payer: Self-pay | Admitting: Internal Medicine

## 2013-12-08 NOTE — Progress Notes (Signed)
I have examined this patient along with the student and agree.  Clarified with patient that this is NOT the worst HA of her life.

## 2014-03-11 ENCOUNTER — Other Ambulatory Visit: Payer: Self-pay | Admitting: Physician Assistant

## 2014-04-09 ENCOUNTER — Other Ambulatory Visit: Payer: Self-pay | Admitting: Physician Assistant

## 2014-04-20 ENCOUNTER — Ambulatory Visit (INDEPENDENT_AMBULATORY_CARE_PROVIDER_SITE_OTHER): Payer: BC Managed Care – PPO | Admitting: Physician Assistant

## 2014-04-20 VITALS — BP 127/75 | HR 94 | Temp 97.7°F | Resp 18 | Ht 61.0 in | Wt 161.4 lb

## 2014-04-20 DIAGNOSIS — I10 Essential (primary) hypertension: Secondary | ICD-10-CM

## 2014-04-20 MED ORDER — LISINOPRIL 10 MG PO TABS: 10.0000 mg | ORAL_TABLET | Freq: Every day | ORAL | Status: DC

## 2014-04-20 NOTE — Progress Notes (Signed)
   Subjective:    Patient ID: Brittany Gibson, female    DOB: 08-08-1971, 43 y.o.   MRN: [id number]  HPI   Brittany Gibson is a very pleasant 43 yr old female here for refill on lisinopril.  Has taken for 2-3 yrs now.  Previously on HCTZ, but has had much better control with change to lisinopril.  BPs typically 120s/70s so pt rarely checks anymore.  She has also been running more frequently which she thinks has positively impacted her BP  She has concern for a lump on her shoulder that has been present for several years.  Reports she had this ultrasounded at the ortho office - she was told this was connected to the muscle and went deep.  She feels like this area is enlarging and would like to know who could remove it   Review of Systems  Constitutional: Negative.   Respiratory: Negative for cough, shortness of breath and wheezing.   Cardiovascular: Negative for chest pain and leg swelling.  Gastrointestinal: Negative.   Musculoskeletal: Negative.        Objective:   Physical Exam  Vitals reviewed. Constitutional: She is oriented to person, place, and time. She appears well-developed and well-nourished. No distress.  HENT:  Head: Normocephalic and atraumatic.  Eyes: Conjunctivae are normal. No scleral icterus.  Cardiovascular: Normal rate, regular rhythm and normal heart sounds.   Pulmonary/Chest: Effort normal and breath sounds normal.  Neurological: She is alert and oriented to person, place, and time.  Skin: Skin is warm and dry.     Small mass at LEFT shoulder; small dilated pore centrally; able to express a tiny amount of sebaceous material  Psychiatric: She has a normal mood and affect. Her behavior is normal.       Assessment & Plan:  Hypertension - Plan: lisinopril (PRINIVIL,ZESTRIL) 10 MG tablet, Basic metabolic panel   Brittany Gibson is a very pleasant 43 yr old female here for lisinopril refill.  BP is very well controlled.  No change in medication.  BMP pending.  Small  sebaceous cyst at LEFT shoulder.  Will have pt schedule an appt at 104 to have this removed    E. Frances Furbish MHS, PA-C [facility] 6/11/201510:26 PM

## 2014-04-20 NOTE — Patient Instructions (Signed)
Continue lisinopril once daily  Check BP maybe once or twice per month  I will let you know when labs are back and if we need to do anything based on that  I sent a message to our schedulers to get you in for an appointment next door to remove the sebaceous cyst.  You can also always always in here if scheduling an appt doesn't work   Epidermal Cyst An epidermal cyst is sometimes called a sebaceous cyst, epidermal inclusion cyst, or infundibular cyst. These cysts usually contain a substance that looks "pasty" or "cheesy" and may have a bad smell. This substance is a protein called keratin. Epidermal cysts are usually found on the face, neck, or trunk. They may also occur in the vaginal area or other parts of the genitalia of both men and women. Epidermal cysts are usually small, painless, slow-growing bumps or lumps that move freely under the skin. It is important not to try to pop them. This may cause an infection and lead to tenderness and swelling. CAUSES  Epidermal cysts may be caused by a deep penetrating injury to the skin or a plugged hair follicle, often associated with acne. SYMPTOMS  Epidermal cysts can become inflamed and cause:  Redness.  Tenderness.  Increased temperature of the skin over the bumps or lumps.  Grayish-white, bad smelling material that drains from the bump or lump. DIAGNOSIS  Epidermal cysts are easily diagnosed by your caregiver during an exam. Rarely, a tissue sample (biopsy) may be taken to rule out other conditions that may resemble epidermal cysts. TREATMENT   Epidermal cysts often get better and disappear on their own. They are rarely ever cancerous.  If a cyst becomes infected, it may become inflamed and tender. This may require opening and draining the cyst. Treatment with antibiotics may be necessary. When the infection is gone, the cyst may be removed with minor surgery.  Small, inflamed cysts can often be treated with antibiotics or by injecting  steroid medicines.  Sometimes, epidermal cysts become large and bothersome. If this happens, surgical removal in your caregiver's office may be necessary. HOME CARE INSTRUCTIONS  Only take over-the-counter or prescription medicines as directed by your caregiver.  Take your antibiotics as directed. Finish them even if you start to feel better. SEEK MEDICAL CARE IF:   Your cyst becomes tender, red, or swollen.  Your condition is not improving or is getting worse.  You have any other questions or concerns. MAKE SURE YOU:  Understand these instructions.  Will watch your condition.  Will get help right away if you are not doing well or get worse. Document Released: 09/27/2004 Document Revised: 01/19/2012 Document Reviewed: 05/05/2011 [facility] Patient Information 2014 [place], [place].

## 2014-04-21 LAB — BASIC METABOLIC PANEL
BUN: 12 mg/dL (ref 6–23)
CALCIUM: 9.8 mg/dL (ref 8.4–10.5)
CO2: 29 mEq/L (ref 19–32)
CREATININE: 0.8 mg/dL (ref 0.50–1.10)
Chloride: 100 mEq/L (ref 96–112)
GLUCOSE: 81 mg/dL (ref 70–99)
POTASSIUM: 4.2 meq/L (ref 3.5–5.3)
Sodium: 137 mEq/L (ref 135–145)

## 2014-07-05 ENCOUNTER — Telehealth: Payer: Self-pay

## 2014-07-05 NOTE — Telephone Encounter (Signed)
Pt. called to inform Dr. Myra Gianotti she is scheduled for a General Surgery Consult 07/19/14 to have a Sebaceous Cyst removed from (L) shoulder.  Due to her past history of Dissection of right ICA, Pseudo aneurysm (L) ICA, and (L) Renal Artery Dissection, requests advise from Dr. Myra Gianotti, as to whether she is at increased risk for further dissection of blood vessels during the sebaceous cyst surgery.  Also, asking if she is at increased risk, would Dr. Myra Gianotti consider removing the sebaceous cyst instead of general surgery.  Advised pt. will send message to Dr. Myra Gianotti, and return call with his response.

## 2014-07-07 ENCOUNTER — Telehealth: Payer: Self-pay | Admitting: *Deleted

## 2014-07-07 NOTE — Telephone Encounter (Signed)
LMTCB, Dr. Myra Gianotti sent staff message that it would be OK to have her sebaceous cyst removed; doubt this would place her at any increased risk of dissection. General surgery is best to remove it.

## 2014-07-11 ENCOUNTER — Ambulatory Visit (HOSPITAL_COMMUNITY)
Admission: RE | Admit: 2014-07-11 | Discharge: 2014-07-11 | Disposition: A | Discharge status: Home / Self Care | Payer: BC Managed Care – PPO | Source: Ambulatory Visit | Attending: Emergency Medicine | Admitting: Emergency Medicine

## 2014-07-11 ENCOUNTER — Ambulatory Visit (INDEPENDENT_AMBULATORY_CARE_PROVIDER_SITE_OTHER): Payer: BC Managed Care – PPO | Admitting: Emergency Medicine

## 2014-07-11 ENCOUNTER — Ambulatory Visit (INDEPENDENT_AMBULATORY_CARE_PROVIDER_SITE_OTHER): Payer: BC Managed Care – PPO | Admitting: General Surgery

## 2014-07-11 ENCOUNTER — Ambulatory Visit (HOSPITAL_COMMUNITY): Admission: RE | Admit: 2014-07-11 | Payer: Self-pay | Source: Ambulatory Visit

## 2014-07-11 ENCOUNTER — Other Ambulatory Visit: Payer: Self-pay | Admitting: Emergency Medicine

## 2014-07-11 VITALS — BP 110/74 | HR 95 | Temp 98.0°F | Resp 20 | Ht 61.61 in | Wt 162.4 lb

## 2014-07-11 DIAGNOSIS — R109 Unspecified abdominal pain: Secondary | ICD-10-CM

## 2014-07-11 DIAGNOSIS — R52 Pain, unspecified: Secondary | ICD-10-CM | POA: Insufficient documentation

## 2014-07-11 DIAGNOSIS — I7773 Dissection of renal artery: Secondary | ICD-10-CM | POA: Insufficient documentation

## 2014-07-11 LAB — POCT CBC
Granulocyte percent: 62.6 %G (ref 37–80)
HEMATOCRIT: 41.7 % (ref 37.7–47.9)
HEMOGLOBIN: 14 g/dL (ref 12.2–16.2)
LYMPH, POC: 3.4 (ref 0.6–3.4)
MCH: 29.8 pg (ref 27–31.2)
MCHC: 33.5 g/dL (ref 31.8–35.4)
MCV: 88.9 fL (ref 80–97)
MID (cbc): 1 — AB (ref 0–0.9)
MPV: 7.4 fL (ref 0–99.8)
POC Granulocyte: 7.4 — AB (ref 2–6.9)
POC LYMPH PERCENT: 28.6 %L (ref 10–50)
POC MID %: 8.8 % (ref 0–12)
Platelet Count, POC: 366 10*3/uL (ref 142–424)
RBC: 4.68 M/uL (ref 4.04–5.48)
RDW, POC: 13.3 %
WBC: 11.9 10*3/uL — AB (ref 4.6–10.2)

## 2014-07-11 LAB — POCT I-STAT CREATININE: Creatinine, Ser: 0.7 mg/dL (ref 0.50–1.10)

## 2014-07-11 MED ORDER — GADOBENATE DIMEGLUMINE 529 MG/ML IV SOLN
15.0000 mL | Freq: Once | INTRAVENOUS | Status: AC | PRN
  Administered 2014-07-11: 15 mL via INTRAVENOUS
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
## 2014-07-11 NOTE — Telephone Encounter (Signed)
07/07/14 @ 2:14 PM : LMTCB, Dr. Myra Gianotti sent staff message that it would be OK to have her sebaceous cyst removed; doubt this would place her at any increased risk of dissection. General surgery is best to remove it; per K. Hassell Done, RN

## 2014-07-12 ENCOUNTER — Telehealth: Payer: Self-pay

## 2014-07-12 MED ORDER — HYDROCODONE-ACETAMINOPHEN 5-325 MG PO TABS: 1.0000 | ORAL_TABLET | Freq: Four times a day (QID) | ORAL | Status: DC | PRN

## 2014-07-12 NOTE — Telephone Encounter (Signed)
Please contact this patient.  MRI results are stable compared to the previous 2 years: IMPRESSION:  1. Stable appearance of dissection in the distal left main renal  artery with stable renal parenchymal loss.  2. No acute or superimposed abnormality identified.  There is no evidence of a kidney stone.  She can get a report of her MRI/CT scans (we can print them), or she can pick up copies of the actual films (she should find out which her specialists prefer).

## 2014-07-12 NOTE — Telephone Encounter (Signed)
Pt is requesting pain medication. She can not take Percocet.

## 2014-07-12 NOTE — Telephone Encounter (Signed)
Rx printed. Meds ordered this encounter  Medications  . HYDROcodone-acetaminophen (NORCO) 5-325 MG per tablet    Sig: Take 1 tablet by mouth every 6 (six) hours as needed.    Dispense:  20 tablet    Refill:  0    Order Specific Question:  Supervising Provider    Answer:  DOOLITTLE, ROBERT P [[id number]]

## 2014-07-12 NOTE — Telephone Encounter (Signed)
Pt was sent for a MRI last night and it was really late when she got out.  Dr. Dareen Piano figured it would be this way and told her to call back today if she needed any pain medication.  He told her it would have to be picked up and she says her husband will come by and get it for her.  She has a history of kidney problems and says please do not prescribe percocet.  She would also like to get copies of her medical records pertaining to this kidney problem, her MRI results from yesterday and a CT scan she had done a while back.  She wants to provide this information to her OBGYN and [facility].

## 2014-07-13 NOTE — Telephone Encounter (Signed)
Pt called back, and would like her MRI and Ct results along with her prescription. Please call pt when ready

## 2014-07-13 NOTE — Telephone Encounter (Signed)
LM advising rx ready to be picked up.

## 2014-07-13 NOTE — Telephone Encounter (Signed)
Pt called back and would like copies of her MRI and CT report. MRI has not been reviewed yet. Please advise MRI results and we will get these copies for her.

## 2014-07-18 NOTE — Telephone Encounter (Signed)
Spoke with patient today. She wanted MRA report and CT report from July 2014. Both have been printed and in the pick up drawer, along with prescription. Will send husband to pick up.

## 2014-07-19 ENCOUNTER — Ambulatory Visit (INDEPENDENT_AMBULATORY_CARE_PROVIDER_SITE_OTHER): Payer: BC Managed Care – PPO | Admitting: Surgery

## 2014-08-21 ENCOUNTER — Other Ambulatory Visit: Payer: Self-pay | Admitting: Obstetrics and Gynecology

## 2014-08-23 LAB — CYTOLOGY - PAP

## 2014-11-13 ENCOUNTER — Ambulatory Visit (INDEPENDENT_AMBULATORY_CARE_PROVIDER_SITE_OTHER): Payer: BLUE CROSS/BLUE SHIELD

## 2014-11-13 ENCOUNTER — Ambulatory Visit (INDEPENDENT_AMBULATORY_CARE_PROVIDER_SITE_OTHER): Payer: BLUE CROSS/BLUE SHIELD | Admitting: Family Medicine

## 2014-11-13 ENCOUNTER — Encounter: Payer: Self-pay | Admitting: Family Medicine

## 2014-11-13 VITALS — BP 120/86 | HR 114 | Temp 98.4°F | Resp 16 | Ht 61.25 in | Wt 172.8 lb

## 2014-11-13 DIAGNOSIS — I10 Essential (primary) hypertension: Secondary | ICD-10-CM

## 2014-11-13 DIAGNOSIS — J9801 Acute bronchospasm: Secondary | ICD-10-CM

## 2014-11-13 DIAGNOSIS — R05 Cough: Secondary | ICD-10-CM

## 2014-11-13 DIAGNOSIS — I720 Aneurysm of carotid artery: Secondary | ICD-10-CM

## 2014-11-13 DIAGNOSIS — R059 Cough, unspecified: Secondary | ICD-10-CM

## 2014-11-13 DIAGNOSIS — R0789 Other chest pain: Secondary | ICD-10-CM

## 2014-11-13 DIAGNOSIS — I7773 Dissection of renal artery: Secondary | ICD-10-CM

## 2014-11-13 DIAGNOSIS — J029 Acute pharyngitis, unspecified: Secondary | ICD-10-CM

## 2014-11-13 LAB — POCT URINALYSIS DIPSTICK
BILIRUBIN UA: NEGATIVE
GLUCOSE UA: NEGATIVE
LEUKOCYTES UA: NEGATIVE
Nitrite, UA: NEGATIVE
Protein, UA: NEGATIVE
Spec Grav, UA: 1.02
Urobilinogen, UA: 0.2
pH, UA: 7

## 2014-11-13 LAB — POCT RAPID STREP A (OFFICE): RAPID STREP A SCREEN: NEGATIVE

## 2014-11-13 LAB — COMPLETE METABOLIC PANEL WITH GFR
ALBUMIN: 4.3 g/dL (ref 3.5–5.2)
ALK PHOS: 70 U/L (ref 39–117)
ALT: 12 U/L (ref 0–35)
AST: 16 U/L (ref 0–37)
BUN: 10 mg/dL (ref 6–23)
CALCIUM: 9.5 mg/dL (ref 8.4–10.5)
CHLORIDE: 99 meq/L (ref 96–112)
CO2: 29 mEq/L (ref 19–32)
Creat: 0.84 mg/dL (ref 0.50–1.10)
GFR, EST NON AFRICAN AMERICAN: 85 mL/min
GLUCOSE: 90 mg/dL (ref 70–99)
Potassium: 3.8 mEq/L (ref 3.5–5.3)
SODIUM: 139 meq/L (ref 135–145)
Total Bilirubin: 0.3 mg/dL (ref 0.2–1.2)
Total Protein: 7.5 g/dL (ref 6.0–8.3)

## 2014-11-13 LAB — CBC WITH DIFFERENTIAL/PLATELET
BASOS PCT: 0 % (ref 0–1)
Basophils Absolute: 0 10*3/uL (ref 0.0–0.1)
EOS ABS: 0.1 10*3/uL (ref 0.0–0.7)
Eosinophils Relative: 1 % (ref 0–5)
HEMATOCRIT: 44.6 % (ref 36.0–46.0)
Hemoglobin: 14.7 g/dL (ref 12.0–15.0)
Lymphocytes Relative: 14 % (ref 12–46)
Lymphs Abs: 1.5 10*3/uL (ref 0.7–4.0)
MCH: 28.9 pg (ref 26.0–34.0)
MCHC: 33 g/dL (ref 30.0–36.0)
MCV: 87.8 fL (ref 78.0–100.0)
MPV: 9.1 fL (ref 8.6–12.4)
Monocytes Absolute: 1 10*3/uL (ref 0.1–1.0)
Monocytes Relative: 10 % (ref 3–12)
NEUTROS PCT: 75 % (ref 43–77)
Neutro Abs: 7.8 10*3/uL — ABNORMAL HIGH (ref 1.7–7.7)
PLATELETS: 375 10*3/uL (ref 150–400)
RBC: 5.08 MIL/uL (ref 3.87–5.11)
RDW: 12.7 % (ref 11.5–15.5)
WBC: 10.4 10*3/uL (ref 4.0–10.5)

## 2014-11-13 LAB — LIPID PANEL
CHOLESTEROL: 172 mg/dL (ref 0–200)
HDL: 46 mg/dL (ref >39)
LDL Cholesterol: 98 mg/dL (ref 0–99)
Total CHOL/HDL Ratio: 3.7 Ratio
Triglycerides: 142 mg/dL (ref <150)
VLDL: 28 mg/dL (ref 0–40)

## 2014-11-13 LAB — TSH: TSH: 2.145 u[IU]/mL (ref 0.350–4.500)

## 2014-11-13 MED ORDER — ALBUTEROL SULFATE HFA 108 (90 BASE) MCG/ACT IN AERS: 2.0000 | INHALATION_SPRAY | RESPIRATORY_TRACT | Status: DC | PRN

## 2014-11-13 MED ORDER — LISINOPRIL 10 MG PO TABS: 10.0000 mg | ORAL_TABLET | Freq: Every day | ORAL | Status: DC

## 2014-11-13 MED ORDER — FLUTICASONE PROPIONATE 50 MCG/ACT NA SUSP: 2.0000 | Freq: Two times a day (BID) | NASAL | Status: DC
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
## 2014-11-15 LAB — CULTURE, GROUP A STREP: Organism ID, Bacteria: NORMAL

## 2014-11-16 ENCOUNTER — Telehealth: Payer: Self-pay | Admitting: *Deleted

## 2014-11-16 NOTE — Telephone Encounter (Signed)
Patient saw Dr. Katrinka BlazingSmith on Monday, 2hrs after she left fever spiked up and has been running steady from 101 to 104, since Monday.  Switching between Ibuprofen and Tylenol.  Using inhaler that Dr. Katrinka BlazingSmith prescribed.  Also developed cold sore and called pharmacy to see if she can use her old prescription that expired May 2015 (Valacyclocir 1gm) along with other medication Dr. Katrinka BlazingSmith prescribed.  How long does this virus normally run its course?   She wants to make sure she is not heading into pneumonia.  Her fever this morning was 100.2  Patient would like to talk to Dr. Katrinka BlazingSmith.  Pt # [phone]

## 2014-11-16 NOTE — Telephone Encounter (Signed)
Dr. Katrinka BlazingSmith can we renew the acyclovir?  Please advise.

## 2014-11-17 ENCOUNTER — Ambulatory Visit (INDEPENDENT_AMBULATORY_CARE_PROVIDER_SITE_OTHER): Payer: BLUE CROSS/BLUE SHIELD | Admitting: Family Medicine

## 2014-11-17 VITALS — BP 128/88 | HR 116 | Temp 98.1°F | Resp 18 | Ht 62.0 in | Wt 173.4 lb

## 2014-11-17 DIAGNOSIS — R509 Fever, unspecified: Secondary | ICD-10-CM

## 2014-11-17 DIAGNOSIS — R05 Cough: Secondary | ICD-10-CM

## 2014-11-17 DIAGNOSIS — B001 Herpesviral vesicular dermatitis: Secondary | ICD-10-CM

## 2014-11-17 DIAGNOSIS — R059 Cough, unspecified: Secondary | ICD-10-CM

## 2014-11-17 DIAGNOSIS — J208 Acute bronchitis due to other specified organisms: Secondary | ICD-10-CM

## 2014-11-17 LAB — POCT CBC
GRANULOCYTE PERCENT: 83.3 % — AB (ref 37–80)
HCT, POC: 41.1 % (ref 37.7–47.9)
Hemoglobin: 13.8 g/dL (ref 12.2–16.2)
Lymph, poc: 0.8 (ref 0.6–3.4)
MCH: 29.2 pg (ref 27–31.2)
MCHC: 33.5 g/dL (ref 31.8–35.4)
MCV: 87.3 fL (ref 80–97)
MID (CBC): 0.4 (ref 0–0.9)
MPV: 7 fL (ref 0–99.8)
POC Granulocyte: 6.2 (ref 2–6.9)
POC LYMPH %: 11.1 % (ref 10–50)
POC MID %: 5.6 %M (ref 0–12)
Platelet Count, POC: 256 10*3/uL (ref 142–424)
RBC: 4.71 M/uL (ref 4.04–5.48)
RDW, POC: 12.7 %
WBC: 7.4 10*3/uL (ref 4.6–10.2)

## 2014-11-17 MED ORDER — VALACYCLOVIR HCL 1 G PO TABS: ORAL_TABLET | ORAL | Status: AC

## 2014-11-17 MED ORDER — HYDROCODONE-HOMATROPINE 5-1.5 MG/5ML PO SYRP: 5.0000 mL | ORAL_SOLUTION | ORAL | Status: DC | PRN

## 2014-11-17 MED ORDER — BENZONATATE 100 MG PO CAPS: 100.0000 mg | ORAL_CAPSULE | Freq: Three times a day (TID) | ORAL | Status: DC | PRN

## 2014-11-17 NOTE — Telephone Encounter (Signed)
Pt is here to be seen in office.

## 2014-11-17 NOTE — Telephone Encounter (Signed)
Patient came in to be seen and decided not to stay. She wants to know if a cough medicine can be called in. She also wants to know how long it will take for bronchitis to turn into pnuemonia.

## 2014-11-17 NOTE — Progress Notes (Signed)
Subjective Patient saw Dr. Katrinka BlazingSmith a few days ago. Since then she has gotten worse. She went home and developed a fever. She has continued to cough a lot. She is totally exhausted. She has not been able to go to work this week. She has had some diaphoretic spells in the nighttime. She has discomfort in her chest. Has developed a fever blister on her lower lip. Only had some out of date fell Cyclovir.  Objective: Ill-appearing. Her TMs are normal. Throat clear. Neck supple without nodes. Chest has a few scattered rhonchi primarily on the left. Some on the right. Heart regular without any murmurs.  She has a fever blister on her lower lip Results for orders placed or performed in visit on 11/17/14  POCT CBC  Result Value Ref Range   WBC 7.4 4.6 - 10.2 K/uL   Lymph, poc 0.8 0.6 - 3.4   POC LYMPH PERCENT 11.1 10 - 50 %L   MID (cbc) 0.4 0 - 0.9   POC MID % 5.6 0 - 12 %M   POC Granulocyte 6.2 2 - 6.9   Granulocyte percent 83.3 (A) 37 - 80 %G   RBC 4.71 4.04 - 5.48 M/uL   Hemoglobin 13.8 12.2 - 16.2 g/dL   HCT, POC 21.341.1 08.637.7 - 47.9 %   MCV 87.3 80 - 97 fL   MCH, POC 29.2 27 - 31.2 pg   MCHC 33.5 31.8 - 35.4 g/dL   RDW, POC 57.812.7 %   Platelet Count, POC 256 142 - 424 K/uL   MPV 7.0 0 - 99.8 fL   Assessment: Viral bronchitis. Fever blister  Plan: Valacyclovir Hycodan Tessalon Fluids and rest Work excuse Return if worse Did not repeat the chest x-ray because a CBC was normal and she had one 4 days ago. This does not seem consistent with a pneumonia. I think it still fits the viral bronchitis it is been going around.

## 2014-11-17 NOTE — Patient Instructions (Signed)
Drink plenty of fluids and continue to get enough rest  Take the cough syrup when sedation will not be a problem  Use the cough pills in the daytime if going to work. They can overlap with the cough syrup.  Use the plain Mucinex if needed for further thinning of secretions  Take the antiviral medication (valacyclovir) as directed for fever blisters.  Return if not improving

## 2014-11-18 NOTE — Telephone Encounter (Signed)
Chart reviewed and noted

## 2014-11-24 ENCOUNTER — Telehealth: Payer: Self-pay

## 2014-11-24 ENCOUNTER — Telehealth: Payer: Self-pay | Admitting: *Deleted

## 2014-11-24 NOTE — Telephone Encounter (Signed)
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  Ear issues - waves, right side of face tingling.  Workers comp previous for [employer].  Takes aspirin daily.  All symptons  Short  Lived.    All of her regular doctors who are treating her  For the workers comp is out of town.  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   [phone]

## 2014-11-24 NOTE — Telephone Encounter (Signed)
Patient called in to report that she has been sick recently and has been on medications per Dr. Alwyn RenHopper (EPIC note 11-17-14). She is now experiencing unilateral facial numbness and tingling, and vision disturbance.She has had 2 episodes of this facial numbness this week. I have encouraged the patient to call 911 and go to the ED for evaluation of symptoms. 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 Myra GianottiBrabham in August of last year. Patient says she has not had a good experience at the ED and that she will have to think about this. I told her that this was my advice, she can decide what she wants to do. She said that she would possibly call Dr. Alwyn RenHopper for his opinion. I again stressed the importance of getting a ED evaluation asap.

## 2014-11-25 NOTE — Telephone Encounter (Signed)
Spoke with pt. She has stopped all of the meds and has not had any SE since. Advised that if any of those sxs return again, even if for a min, she needs to be evaluated. Pt agreeable to plan.

## 2015-05-01 ENCOUNTER — Emergency Department (HOSPITAL_COMMUNITY)
Admission: EM | Admit: 2015-05-01 | Discharge: 2015-05-01 | Disposition: A | Discharge status: Home / Self Care | Payer: BLUE CROSS/BLUE SHIELD | Attending: Emergency Medicine | Admitting: Emergency Medicine

## 2015-05-01 ENCOUNTER — Encounter (HOSPITAL_COMMUNITY): Payer: Self-pay | Admitting: Emergency Medicine

## 2015-05-01 ENCOUNTER — Emergency Department (HOSPITAL_COMMUNITY): Payer: BLUE CROSS/BLUE SHIELD

## 2015-05-01 DIAGNOSIS — Z79899 Other long term (current) drug therapy: Secondary | ICD-10-CM | POA: Insufficient documentation

## 2015-05-01 DIAGNOSIS — M1990 Unspecified osteoarthritis, unspecified site: Secondary | ICD-10-CM | POA: Insufficient documentation

## 2015-05-01 DIAGNOSIS — R079 Chest pain, unspecified: Secondary | ICD-10-CM

## 2015-05-01 DIAGNOSIS — Z87891 Personal history of nicotine dependence: Secondary | ICD-10-CM | POA: Insufficient documentation

## 2015-05-01 DIAGNOSIS — R0602 Shortness of breath: Secondary | ICD-10-CM | POA: Insufficient documentation

## 2015-05-01 DIAGNOSIS — I10 Essential (primary) hypertension: Secondary | ICD-10-CM | POA: Insufficient documentation

## 2015-05-01 DIAGNOSIS — Z7982 Long term (current) use of aspirin: Secondary | ICD-10-CM | POA: Diagnosis not present

## 2015-05-01 DIAGNOSIS — R05 Cough: Secondary | ICD-10-CM | POA: Diagnosis not present

## 2015-05-01 DIAGNOSIS — R059 Cough, unspecified: Secondary | ICD-10-CM

## 2015-05-01 LAB — CBC WITH DIFFERENTIAL/PLATELET
BASOS PCT: 0 % (ref 0–1)
Basophils Absolute: 0 10*3/uL (ref 0.0–0.1)
Eosinophils Absolute: 0.2 10*3/uL (ref 0.0–0.7)
Eosinophils Relative: 1 % (ref 0–5)
HCT: 42.7 % (ref 36.0–46.0)
HEMOGLOBIN: 14.7 g/dL (ref 12.0–15.0)
LYMPHS ABS: 2.8 10*3/uL (ref 0.7–4.0)
Lymphocytes Relative: 23 % (ref 12–46)
MCH: 29.5 pg (ref 26.0–34.0)
MCHC: 34.4 g/dL (ref 30.0–36.0)
MCV: 85.6 fL (ref 78.0–100.0)
Monocytes Absolute: 1.2 10*3/uL — ABNORMAL HIGH (ref 0.1–1.0)
Monocytes Relative: 10 % (ref 3–12)
NEUTROS ABS: 8.1 10*3/uL — AB (ref 1.7–7.7)
NEUTROS PCT: 66 % (ref 43–77)
Platelets: 372 10*3/uL (ref 150–400)
RBC: 4.99 MIL/uL (ref 3.87–5.11)
RDW: 12.3 % (ref 11.5–15.5)
WBC: 12.2 10*3/uL — AB (ref 4.0–10.5)

## 2015-05-01 LAB — BASIC METABOLIC PANEL
ANION GAP: 11 (ref 5–15)
BUN: 15 mg/dL (ref 6–20)
CHLORIDE: 102 mmol/L (ref 101–111)
CO2: 24 mmol/L (ref 22–32)
Calcium: 9.5 mg/dL (ref 8.9–10.3)
Creatinine, Ser: 0.85 mg/dL (ref 0.44–1.00)
GFR calc non Af Amer: >60 mL/min (ref >60)
Glucose, Bld: 89 mg/dL (ref 65–99)
Potassium: 3.9 mmol/L (ref 3.5–5.1)
Sodium: 137 mmol/L (ref 135–145)

## 2015-05-01 LAB — I-STAT TROPONIN, ED: Troponin i, poc: 0 ng/mL (ref 0.00–0.08)

## 2015-05-01 LAB — D-DIMER, QUANTITATIVE: D-Dimer, Quant: <0.27 ug/mL-FEU (ref 0.00–0.48)

## 2015-05-01 MED ORDER — AZITHROMYCIN 250 MG PO TABS: 250.0000 mg | ORAL_TABLET | Freq: Every day | ORAL | Status: DC

## 2015-05-01 NOTE — Discharge Instructions (Signed)
Chest Wall Pain °Chest wall pain is pain in or around the bones and muscles of your chest. It may take up to 6 weeks to get better. It may take longer if you must stay physically active in your work and activities.  °CAUSES  °Chest wall pain may happen on its own. However, it may be caused by: °· A viral illness like the flu. °· Injury. °· Coughing. °· Exercise. °· Arthritis. °· Fibromyalgia. °· Shingles. °HOME CARE INSTRUCTIONS  °· Avoid overtiring physical activity. Try not to strain or perform activities that cause pain. This includes any activities using your chest or your abdominal and side muscles, especially if heavy weights are used. °· Put ice on the sore area. °¨ Put ice in a plastic bag. °¨ Place a towel between your skin and the bag. °¨ Leave the ice on for 15-20 minutes per hour while awake for the first 2 days. °· Only take over-the-counter or prescription medicines for pain, discomfort, or fever as directed by your caregiver. °SEEK IMMEDIATE MEDICAL CARE IF:  °· Your pain increases, or you are very uncomfortable. °· You have a fever. °· Your chest pain becomes worse. °· You have new, unexplained symptoms. °· You have nausea or vomiting. °· You feel sweaty or lightheaded. °· You have a cough with phlegm (sputum), or you cough up blood. °MAKE SURE YOU:  °· Understand these instructions. °· Will watch your condition. °· Will get help right away if you are not doing well or get worse. °Document Released: 10/27/2005 Document Revised: 01/19/2012 Document Reviewed: 06/23/2011 °ExitCare® Patient Information ©2015 [employer]. This information is not intended to replace advice given to you by your health care provider. Make sure you discuss any questions you have with your health care provider. ° °Cough, Adult ° A cough is a reflex that helps clear your throat and airways. It can help heal the body or may be a reaction to an irritated airway. A cough may only last 2 or 3 weeks (acute) or may last more than 8  weeks (chronic).  °CAUSES °Acute cough: °· Viral or bacterial infections. °Chronic cough: °· Infections. °· Allergies. °· Asthma. °· Post-nasal drip. °· Smoking. °· Heartburn or acid reflux. °· Some medicines. °· Chronic lung problems (COPD). °· Cancer. °SYMPTOMS  °· Cough. °· Fever. °· Chest pain. °· Increased breathing rate. °· High-pitched whistling sound when breathing (wheezing). °· Colored mucus that you cough up (sputum). °TREATMENT  °· A bacterial cough may be treated with antibiotic medicine. °· A viral cough must run its course and will not respond to antibiotics. °· Your caregiver may recommend other treatments if you have a chronic cough. °HOME CARE INSTRUCTIONS  °· Only take over-the-counter or prescription medicines for pain, discomfort, or fever as directed by your caregiver. Use cough suppressants only as directed by your caregiver. °· Use a cold steam vaporizer or humidifier in your bedroom or home to help loosen secretions. °· Sleep in a semi-upright position if your cough is worse at night. °· Rest as needed. °· Stop smoking if you smoke. °SEEK IMMEDIATE MEDICAL CARE IF:  °· You have pus in your sputum. °· Your cough starts to worsen. °· You cannot control your cough with suppressants and are losing sleep. °· You begin coughing up blood. °· You have difficulty breathing. °· You develop pain which is getting worse or is uncontrolled with medicine. °· You have a fever. °MAKE SURE YOU:  °· Understand these instructions. °· Will watch your   condition. °· Will get help right away if you are not doing well or get worse. °Document Released: 04/25/2011 Document Revised: 01/19/2012 Document Reviewed: 04/25/2011 °ExitCare® Patient Information ©2015 [employer]. This information is not intended to replace advice given to you by your health care provider. Make sure you discuss any questions you have with your health care provider. ° °

## 2015-05-01 NOTE — ED Provider Notes (Signed)
CSN: [id number]     Arrival date & time 05/01/15  1743 History   First MD Initiated Contact with Patient 05/01/15 1803     Chief Complaint  Patient presents with  . Chest Pain     (Consider location/radiation/quality/duration/timing/severity/associated sxs/prior Treatment) HPI Comments: Patient with past medical history of hypertension, renal artery dissection, and carotid pseudoaneurysm presents to the emergency department with chief complaint of chest pain. She reports associated cough and shortness of breath. States that she has been coughing times one week. 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 She states that she has chest pain while coughing. She reports that she has had some productive cough. She denies associated fevers, chills, nausea, or vomiting. She states that she was sent from urgent care to the emergency department to rule out pulmonary embolism. She has never had a PE or DVT. She denies any recent travel or surgeries. She does not take birth control.  The history is provided by the patient. No language interpreter was used.    Past Medical History  Diagnosis Date  . Hypertension   . Arthritis   . Renal artery dissection     Left renal artery  . 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 Cesarean section    . Tubal ligation     Family History  Problem Relation Age of Onset  . Hypertension Mother   . Diabetes Mother   . Hypertension Father   . Hypertension Sister    History  Substance Use Topics  . Smoking status: Former Smoker    Types: Cigarettes    Quit date: 11/10/1993  . Smokeless tobacco: Never Used  . Alcohol Use: No   OB History    No data available     Review of Systems  Constitutional: Negative for fever and chills.  Respiratory: Positive for cough and shortness of breath.   Cardiovascular: Positive for  chest pain.  Gastrointestinal: Negative for nausea, vomiting, diarrhea and constipation.  Genitourinary: Negative for dysuria.  All other systems reviewed and are negative.     Allergies  Iohexol; Sulfa antibiotics; Tetanus toxoids; and Tuberculin tests  Home Medications   Prior to Admission medications   Medication Sig Start Date End Date Taking? Authorizing Provider  aspirin EC 81 MG tablet Take 81 mg by mouth daily.   Yes Historical Provider, MD  lisinopril (PRINIVIL,ZESTRIL) 10 MG tablet Take 1 tablet (10 mg total) by mouth daily. 11/13/14  Yes Ethelda Chick, MD  Multiple Vitamin (MULTIVITAMIN WITH MINERALS) TABS tablet Take 2 tablets by mouth daily.   Yes Historical Provider, MD  valACYclovir (VALTREX) 1000 MG tablet Take 2 pills initially, then repeat 2 pills in 12 hours one dose for fever blisters 11/17/14  Yes Peyton Najjar, MD  albuterol (PROVENTIL HFA;VENTOLIN HFA) 108 (90 BASE) MCG/ACT inhaler Inhale 2 puffs into the lungs every 4 (four) hours as needed for wheezing or shortness of breath (cough, shortness of breath or wheezing.). Patient not taking: Reported on 05/01/2015 11/13/14   Ethelda Chick, MD  benzonatate (TESSALON) 100 MG capsule Take 1-2 capsules (100-200 mg total) by mouth 3 (three) times daily as needed. Patient not taking: Reported on 05/01/2015 11/17/14   Peyton Najjar, MD  fluticasone [facility]) 50 MCG/ACT nasal spray Place 2 sprays into both nostrils 2 (two) times daily. Patient not taking: Reported on  05/01/2015 11/13/14   Ethelda Chick, MD  HYDROcodone-homatropine (HYCODAN) 5-1.5 MG/5ML syrup Take 5 mLs by mouth every 4 (four) hours as needed. Patient not taking: Reported on 05/01/2015 11/17/14   Peyton Najjar, MD   BP 118/92 mmHg  Pulse 86  Temp(Src) 98.3 F (36.8 C) (Oral)  Resp 20  SpO2 99% Physical Exam  Constitutional: She is oriented to person, place, and time. She appears well-developed and well-nourished.  HENT:  Head: Normocephalic and atraumatic.   Eyes: Conjunctivae and EOM are normal. Pupils are equal, round, and reactive to light.  Neck: Normal range of motion. Neck supple.  Cardiovascular: Normal rate and regular rhythm.  Exam reveals no gallop and no friction rub.   No murmur heard. Pulmonary/Chest: Effort normal and breath sounds normal. No respiratory distress. She has no wheezes. She has no rales. She exhibits no tenderness.  Abdominal: Soft. Bowel sounds are normal. She exhibits no distension and no mass. There is no tenderness. There is no rebound and no guarding.  Musculoskeletal: Normal range of motion. She exhibits no edema or tenderness.  Neurological: She is alert and oriented to person, place, and time.  Skin: Skin is warm and dry.  Psychiatric: She has a normal mood and affect. Her behavior is normal. Judgment and thought content normal.  Nursing note and vitals reviewed.   ED Course  Procedures (including critical care time) Results for orders placed or performed during the hospital encounter of 05/01/15  D-dimer, quantitative (not at [facility])  Result Value Ref Range   D-Dimer, Quant <0.27 0.00 - 0.48 ug/mL-FEU  CBC with Differential/Platelet  Result Value Ref Range   WBC 12.2 (H) 4.0 - 10.5 K/uL   RBC 4.99 3.87 - 5.11 MIL/uL   Hemoglobin 14.7 12.0 - 15.0 g/dL   HCT 19.5 09.3 - 26.7 %   MCV 85.6 78.0 - 100.0 fL   MCH 29.5 26.0 - 34.0 pg   MCHC 34.4 30.0 - 36.0 g/dL   RDW 12.4 58.0 - 99.8 %   Platelets 372 150 - 400 K/uL   Neutrophils Relative % 66 43 - 77 %   Neutro Abs 8.1 (H) 1.7 - 7.7 K/uL   Lymphocytes Relative 23 12 - 46 %   Lymphs Abs 2.8 0.7 - 4.0 K/uL   Monocytes Relative 10 3 - 12 %   Monocytes Absolute 1.2 (H) 0.1 - 1.0 K/uL   Eosinophils Relative 1 0 - 5 %   Eosinophils Absolute 0.2 0.0 - 0.7 K/uL   Basophils Relative 0 0 - 1 %   Basophils Absolute 0.0 0.0 - 0.1 K/uL  Basic metabolic panel  Result Value Ref Range   Sodium 137 135 - 145 mmol/L   Potassium 3.9 3.5 - 5.1 mmol/L   Chloride 102  101 - 111 mmol/L   CO2 24 22 - 32 mmol/L   Glucose, Bld 89 65 - 99 mg/dL   BUN 15 6 - 20 mg/dL   Creatinine, Ser 3.38 0.44 - 1.00 mg/dL   Calcium 9.5 8.9 - 25.0 mg/dL   GFR calc non Af Amer >60 >60 mL/min   GFR calc Af Amer >60 >60 mL/min   Anion gap 11 5 - 15  I-Stat Troponin, ED (not at [facility])  Result Value Ref Range   Troponin i, poc 0.00 0.00 - 0.08 ng/mL   Comment 3           Dg Chest 2 View  05/01/2015   CLINICAL DATA:  Chest pain, shortness  of breath, productive cough  EXAM: CHEST  2 VIEW  COMPARISON:  11/13/2014  FINDINGS: Lungs are clear.  No pleural effusion or pneumothorax.  The heart is normal in size.  Visualized osseous structures are within normal limits.  IMPRESSION: Normal chest radiographs.   Electronically Signed   By: Charline Bills M.D.   On: 05/01/2015 19:32     Imaging Review No results found.   EKG Interpretation None      ED ECG REPORT  I personally interpreted this EKG   Date: 05/01/2015   Rate: 97  Rhythm: normal sinus rhythm  QRS Axis: normal  Intervals: normal  ST/T Wave abnormalities: normal  Conduction Disutrbances:none  Narrative Interpretation:   Old EKG Reviewed: none available    MDM   Final diagnoses:  Cough  Chest pain, unspecified chest pain type   Patient with chest pain. Sent to ED by urgent care for further evaluation. Will check basic labs, EKG, chest x-ray. Doubt PE or DVT, PERC negative.  Patient very concerned about blood clots and asks about CT scan.  Discussed D-dimer, and will start with this.  D-dimer is negative, troponin negative, chest x-ray negative.  Patient discharged to home.  Patient understands and agrees with the plan.    Roxy Horseman, PA-C 05/01/15 2032  Roxy Horseman, PA-C 05/01/15 [id number]  Linwood Dibbles, MD 05/01/15 2043

## 2015-05-01 NOTE — ED Notes (Signed)
Pt reports CP and SOB since Saturday along with productive cough. Was sent here from urgent care to rule out PE. 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 Able to speak full sentences. No hx of blood clots.

## 2015-05-13 ENCOUNTER — Other Ambulatory Visit: Payer: Self-pay | Admitting: Family Medicine

## 2015-06-07 ENCOUNTER — Telehealth: Payer: Self-pay

## 2015-06-07 ENCOUNTER — Ambulatory Visit (INDEPENDENT_AMBULATORY_CARE_PROVIDER_SITE_OTHER): Payer: BLUE CROSS/BLUE SHIELD | Admitting: Physician Assistant

## 2015-06-07 VITALS — BP 122/80 | HR 90 | Temp 98.2°F | Resp 16 | Ht 62.0 in | Wt 161.6 lb

## 2015-06-07 DIAGNOSIS — Z23 Encounter for immunization: Secondary | ICD-10-CM | POA: Diagnosis not present

## 2015-06-07 DIAGNOSIS — I10 Essential (primary) hypertension: Secondary | ICD-10-CM | POA: Insufficient documentation

## 2015-06-07 DIAGNOSIS — K219 Gastro-esophageal reflux disease without esophagitis: Secondary | ICD-10-CM

## 2015-06-07 DIAGNOSIS — H6121 Impacted cerumen, right ear: Secondary | ICD-10-CM | POA: Diagnosis not present

## 2015-06-07 DIAGNOSIS — Z0000 Encounter for general adult medical examination without abnormal findings: Secondary | ICD-10-CM

## 2015-06-07 DIAGNOSIS — Z7189 Other specified counseling: Secondary | ICD-10-CM

## 2015-06-07 DIAGNOSIS — Z7185 Encounter for immunization safety counseling: Secondary | ICD-10-CM

## 2015-06-07 MED ORDER — LISINOPRIL 10 MG PO TABS: 10.0000 mg | ORAL_TABLET | Freq: Every day | ORAL | Status: DC

## 2015-06-07 MED ORDER — ACETAMINOPHEN 325 MG PO TABS: 500.0000 mg | ORAL_TABLET | Freq: Once | ORAL | Status: DC

## 2015-06-07 MED ORDER — DIPHENHYDRAMINE HCL 25 MG PO CAPS: 25.0000 mg | ORAL_CAPSULE | Freq: Once | ORAL | Status: DC

## 2015-06-07 NOTE — Progress Notes (Signed)
Subjective:    Patient ID: Brittany Gibson, female    DOB: Mar 16, 1971, 44 y.o.   MRN: [id number]  Chief Complaint  Patient presents with  . Annual Exam    CPE  . Ear Pain    x 2 weeks  . Sinus Problem   Patient Active Problem List   Diagnosis Date Noted  . Essential hypertension 06/07/2015  . 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 Authorizing Provider  aspirin EC 81 MG tablet Take 81 mg by mouth daily.   Yes Historical Provider, MD  fluticasone (FLONASE) 50 MCG/ACT nasal spray Place 2 sprays into both nostrils 2 (two) times daily. 11/13/14  Yes Wardell Honour, MD  lisinopril (PRINIVIL,ZESTRIL) 10 MG tablet Take 1 tablet (10 mg total) by mouth daily. PATIENT NEEDS OFFICE VISIT FOR ADDITIONAL REFILLS 05/15/15  Yes Chelle Jeffery, PA-C  Multiple Vitamin (MULTIVITAMIN WITH MINERALS) TABS tablet Take 2 tablets by mouth daily.   Yes Historical Provider, MD  albuterol (PROVENTIL HFA;VENTOLIN HFA) 108 (90 BASE) MCG/ACT inhaler Inhale 2 puffs into the lungs every 4 (four) hours as needed for wheezing or shortness of breath (cough, shortness of breath or wheezing.). Patient not taking: Reported on 05/01/2015 11/13/14   Wardell Honour, MD  valACYclovir (VALTREX) 1000 MG tablet Take 2 pills initially, then repeat 2 pills in 12 hours one dose for fever blisters Patient not taking: Reported on 06/07/2015 11/17/14   Posey Boyer, MD   Medications, allergies, past medical history, surgical history, family history, social history and problem list reviewed and updated.  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   UTD on pap, mammo. She had normal bmp and cbc with mild leukocytosis 3 wks ago.   Brings form with her she needs signed. Needs proof of hep b, td, and mmr vaccination.  Not in immunization registry. Pt cannot recall if and when she has had these immunizations.   She had tdap vaccine approx 12 yrs ago and recalls having a rash around the injection site. No anaphylactic sx such as trouble breathing, trouble swallowing, swollen lips/tongue.   Review of Systems No fevers, chills, cp, sob, abd pain.     Objective:   Physical Exam  Constitutional: She is oriented to person, place, and time. She appears well-developed and well-nourished.  Non-toxic appearance. She does not have a sickly appearance. She does not appear ill. No distress.  BP 122/80 mmHg  Pulse 90  Temp(Src) 98.2 F (36.8 C) (Oral)  Resp 16  Ht 5' 2" (1.575 m)  Wt 161 lb 9.6 oz (73.301 kg)  BMI 29.55 kg/m2  SpO2 99%  LMP 05/25/2015   HENT:  Right Ear: Tympanic membrane normal.  Left Ear: Tympanic membrane normal.  Mouth/Throat: Uvula is midline, oropharynx is clear and moist and mucous membranes are normal.  Right ear with cerumen impaction which was flushed. TM normal post flushing.   Eyes: Conjunctivae and EOM are normal. Pupils are equal, round, and reactive to light.  Cardiovascular: Normal rate, regular rhythm and normal heart sounds.   Pulmonary/Chest: Effort normal and breath sounds normal. No tachypnea.  Neurological: She is alert and oriented to person, place, and time. She has normal strength. No cranial nerve deficit or sensory deficit.  Assessment & Plan:   Annual physical exam  Cerumen impaction, right  Immunization counseling - Plan: Measles/Mumps/Rubella Immunity, Hepatitis B surface antibody, Hepatitis B surface antigen, Quantiferon tb gold assay (blood)  Need for TD vaccine - Plan: Td vaccine greater than or equal to 7yo preservative free IM, acetaminophen (TYLENOL) tablet 487.5 mg, diphenhydrAMINE (BENADRYL) capsule 25 mg  Essential hypertension --normal exam, vitals, form signed for new employment --drawing mmr titers, hep b antigen/antibody today --td  vaccine given today as hx allergy was not an anaphylactic rxn and was a localized skin rash --> 25 mg benadryl and 500 mg tylenol in clinic --quantiferon gold for tb as pt wasn't sure if she had previously had a false positive ppd test --right ear flushed --refilled lisinopril for 6 months per pt request, normal bmp 3 wks ago  Julieta Gutting, PA-C Physician Assistant-Certified [facility]  06/07/2015 12:55 PM

## 2015-06-07 NOTE — Telephone Encounter (Signed)
Patient was seen today and the prescription for omeprazole wasn't  sent to the pharmacy.

## 2015-06-07 NOTE — Patient Instructions (Signed)
You received the td vaccine today. Be sure to go to the ER asap with any lip or tongue swelling or any trouble breathing or swallowing.  We are checking for immunity to hep b and mmr today, we are checking for tb today.  If these come back normal we will sign your form and you can come pick this up.   Health Maintenance Adopting a healthy lifestyle and getting preventive care can go a long way to promote health and wellness. Talk with your health care provider about what schedule of regular examinations is right for you. This is a good chance for you to check in with your provider about disease prevention and staying healthy. In between checkups, there are plenty of things you can do on your own. Experts have done a lot of research about which lifestyle changes and preventive measures are most likely to keep you healthy. Ask your health care provider for more information. WEIGHT AND DIET  Eat a healthy diet  Be sure to include plenty of vegetables, fruits, low-fat dairy products, and lean protein.  Do not eat a lot of foods high in solid fats, added sugars, or salt.  Get regular exercise. This is one of the most important things you can do for your health.  Most adults should exercise for at least 150 minutes each week. The exercise should increase your heart rate and make you sweat (moderate-intensity exercise).  Most adults should also do strengthening exercises at least twice a week. This is in addition to the moderate-intensity exercise.  Maintain a healthy weight  Body mass index (BMI) is a measurement that can be used to identify possible weight problems. It estimates body fat based on height and weight. Your health care provider can help determine your BMI and help you achieve or maintain a healthy weight.  For females 43 years of age and older:   A BMI below 18.5 is considered underweight.  A BMI of 18.5 to 24.9 is normal.  A BMI of 25 to 29.9 is considered overweight.  A  BMI of 30 and above is considered obese.  Watch levels of cholesterol and blood lipids  You should start having your blood tested for lipids and cholesterol at 44 years of age, then have this test every 5 years.  You may need to have your cholesterol levels checked more often if:  Your lipid or cholesterol levels are high.  You are older than 44 years of age.  You are at high risk for heart disease.  CANCER SCREENING   Lung Cancer  Lung cancer screening is recommended for adults 11-27 years old who are at high risk for lung cancer because of a history of smoking.  A yearly low-dose CT scan of the lungs is recommended for people who:  Currently smoke.  Have quit within the past 15 years.  Have at least a 30-pack-year history of smoking. A pack year is smoking an average of one pack of cigarettes a day for 1 year.  Yearly screening should continue until it has been 15 years since you quit.  Yearly screening should stop if you develop a health problem that would prevent you from having lung cancer treatment.  Breast Cancer  Practice breast self-awareness. This means understanding how your breasts normally appear and feel.  It also means doing regular breast self-exams. Let your health care provider know about any changes, no matter how small.  If you are in your 20s or 30s, you should have  a clinical breast exam (CBE) by a health care provider every 1-3 years as part of a regular health exam.  If you are 63 or older, have a CBE every year. Also consider having a breast X-ray (mammogram) every year.  If you have a family history of breast cancer, talk to your health care provider about genetic screening.  If you are at high risk for breast cancer, talk to your health care provider about having an MRI and a mammogram every year.  Breast cancer gene (BRCA) assessment is recommended for women who have family members with BRCA-related cancers. BRCA-related cancers  include:  Breast.  Ovarian.  Tubal.  Peritoneal cancers.  Results of the assessment will determine the need for genetic counseling and BRCA1 and BRCA2 testing. Cervical Cancer Routine pelvic examinations to screen for cervical cancer are no longer recommended for nonpregnant women who are considered low risk for cancer of the pelvic organs (ovaries, uterus, and vagina) and who do not have symptoms. A pelvic examination may be necessary if you have symptoms including those associated with pelvic infections. Ask your health care provider if a screening pelvic exam is right for you.   The Pap test is the screening test for cervical cancer for women who are considered at risk.  If you had a hysterectomy for a problem that was not cancer or a condition that could lead to cancer, then you no longer need Pap tests.  If you are older than 65 years, and you have had normal Pap tests for the past 10 years, you no longer need to have Pap tests.  If you have had past treatment for cervical cancer or a condition that could lead to cancer, you need Pap tests and screening for cancer for at least 20 years after your treatment.  If you no longer get a Pap test, assess your risk factors if they change (such as having a new sexual partner). This can affect whether you should start being screened again.  Some women have medical problems that increase their chance of getting cervical cancer. If this is the case for you, your health care provider may recommend more frequent screening and Pap tests.  The human papillomavirus (HPV) test is another test that may be used for cervical cancer screening. The HPV test looks for the virus that can cause cell changes in the cervix. The cells collected during the Pap test can be tested for HPV.  The HPV test can be used to screen women 64 years of age and older. Getting tested for HPV can extend the interval between normal Pap tests from three to five years.  An HPV  test also should be used to screen women of any age who have unclear Pap test results.  After 44 years of age, women should have HPV testing as often as Pap tests.  Colorectal Cancer  This type of cancer can be detected and often prevented.  Routine colorectal cancer screening usually begins at 44 years of age and continues through 44 years of age.  Your health care provider may recommend screening at an earlier age if you have risk factors for colon cancer.  Your health care provider may also recommend using home test kits to check for hidden blood in the stool.  A small camera at the end of a tube can be used to examine your colon directly (sigmoidoscopy or colonoscopy). This is done to check for the earliest forms of colorectal cancer.  Routine screening usually begins  at age 50.  Direct examination of the colon should be repeated every 5-10 years through 44 years of age. However, you may need to be screened more often if early forms of precancerous polyps or small growths are found. Skin Cancer  Check your skin from head to toe regularly.  Tell your health care provider about any new moles or changes in moles, especially if there is a change in a mole's shape or color.  Also tell your health care provider if you have a mole that is larger than the size of a pencil eraser.  Always use sunscreen. Apply sunscreen liberally and repeatedly throughout the day.  Protect yourself by wearing long sleeves, pants, a wide-brimmed hat, and sunglasses whenever you are outside. HEART DISEASE, DIABETES, AND HIGH BLOOD PRESSURE   Have your blood pressure checked at least every 1-2 years. High blood pressure causes heart disease and increases the risk of stroke.  If you are between 55 years and 79 years old, ask your health care provider if you should take aspirin to prevent strokes.  Have regular diabetes screenings. This involves taking a blood sample to check your fasting blood sugar  level.  If you are at a normal weight and have a low risk for diabetes, have this test once every three years after 45 years of age.  If you are overweight and have a high risk for diabetes, consider being tested at a younger age or more often. PREVENTING INFECTION  Hepatitis B  If you have a higher risk for hepatitis B, you should be screened for this virus. You are considered at high risk for hepatitis B if:  You were born in a country where hepatitis B is common. Ask your health care provider which countries are considered high risk.  Your parents were born in a high-risk country, and you have not been immunized against hepatitis B (hepatitis B vaccine).  You have HIV or AIDS.  You use needles to inject street drugs.  You live with someone who has hepatitis B.  You have had sex with someone who has hepatitis B.  You get hemodialysis treatment.  You take certain medicines for conditions, including cancer, organ transplantation, and autoimmune conditions. Hepatitis C  Blood testing is recommended for:  Everyone born from 1945 through 1965.  Anyone with known risk factors for hepatitis C. Sexually transmitted infections (STIs)  You should be screened for sexually transmitted infections (STIs) including gonorrhea and chlamydia if:  You are sexually active and are younger than 44 years of age.  You are older than 44 years of age and your health care provider tells you that you are at risk for this type of infection.  Your sexual activity has changed since you were last screened and you are at an increased risk for chlamydia or gonorrhea. Ask your health care provider if you are at risk.  If you do not have HIV, but are at risk, it may be recommended that you take a prescription medicine daily to prevent HIV infection. This is called pre-exposure prophylaxis (PrEP). You are considered at risk if:  You are sexually active and do not regularly use condoms or know the HIV status  of your partner(s).  You take drugs by injection.  You are sexually active with a partner who has HIV. Talk with your health care provider about whether you are at high risk of being infected with HIV. If you choose to begin PrEP, you should first be tested for HIV.   You should then be tested every 3 months for as long as you are taking PrEP.  PREGNANCY   If you are premenopausal and you may become pregnant, ask your health care provider about preconception counseling.  If you may become pregnant, take 400 to 800 micrograms (mcg) of folic acid every day.  If you want to prevent pregnancy, talk to your health care provider about birth control (contraception). OSTEOPOROSIS AND MENOPAUSE   Osteoporosis is a disease in which the bones lose minerals and strength with aging. This can result in serious bone fractures. Your risk for osteoporosis can be identified using a bone density scan.  If you are 56 years of age or older, or if you are at risk for osteoporosis and fractures, ask your health care provider if you should be screened.  Ask your health care provider whether you should take a calcium or vitamin D supplement to lower your risk for osteoporosis.  Menopause may have certain physical symptoms and risks.  Hormone replacement therapy may reduce some of these symptoms and risks. Talk to your health care provider about whether hormone replacement therapy is right for you.  HOME CARE INSTRUCTIONS   Schedule regular health, dental, and eye exams.  Stay current with your immunizations.   Do not use any tobacco products including cigarettes, chewing tobacco, or electronic cigarettes.  If you are pregnant, do not drink alcohol.  If you are breastfeeding, limit how much and how often you drink alcohol.  Limit alcohol intake to no more than 1 drink per day for nonpregnant women. One drink equals 12 ounces of beer, 5 ounces of wine, or 1 ounces of hard liquor.  Do not use street  drugs.  Do not share needles.  Ask your health care provider for help if you need support or information about quitting drugs.  Tell your health care provider if you often feel depressed.  Tell your health care provider if you have ever been abused or do not feel safe at home. Document Released: 05/12/2011 Document Revised: 03/13/2014 Document Reviewed: 09/28/2013 [facility] Patient Information 2015 [place], [place]. This information is not intended to replace advice given to you by your health care provider. Make sure you discuss any questions you have with your health care provider.

## 2015-06-08 LAB — HEPATITIS B SURFACE ANTIBODY, QUANTITATIVE: HEPATITIS B-POST: 0 m[IU]/mL

## 2015-06-08 LAB — MEASLES/MUMPS/RUBELLA IMMUNITY
MUMPS IGG: 80.4 [AU]/ml — AB (ref <9.00)
Rubella: 1.82 Index — ABNORMAL HIGH (ref <0.90)
Rubeola IgG: 5.68 AU/mL (ref <25.00)

## 2015-06-08 LAB — HEPATITIS B SURFACE ANTIGEN: HEP B S AG: NEGATIVE

## 2015-06-08 MED ORDER — OMEPRAZOLE 40 MG PO CPDR: 40.0000 mg | DELAYED_RELEASE_CAPSULE | Freq: Every day | ORAL | Status: DC

## 2015-06-08 NOTE — Telephone Encounter (Signed)
Pt did mention reflux sx after large meals and especially after wine for past few months at least. Has had this intermittent for yrs and used to take omeprazole but stopped taking couple yrs ago. Has not been taking anything for it. We discussed a trial of omeprazole and she is interested in this. Will send in script.

## 2015-06-10 LAB — QUANTIFERON TB GOLD ASSAY (BLOOD)
INTERFERON GAMMA RELEASE ASSAY: NEGATIVE
Mitogen value: >10 IU/mL
QUANTIFERON TB AG MINUS NIL: 0.2 [IU]/mL
Quantiferon Nil Value: 0.1 IU/mL
TB AG VALUE: 0.3 [IU]/mL

## 2015-06-12 ENCOUNTER — Other Ambulatory Visit: Payer: Self-pay | Admitting: Physician Assistant

## 2015-06-12 ENCOUNTER — Telehealth: Payer: Self-pay | Admitting: Physician Assistant

## 2015-06-12 NOTE — Telephone Encounter (Signed)
Future order placed for mmr and hep b.  

## 2015-06-14 ENCOUNTER — Telehealth: Payer: Self-pay

## 2015-06-14 NOTE — Telephone Encounter (Signed)
Left VM with details regarding MMR and Hep B vaccine. Said she can come in to have immunizations done and the orders will already be in.

## 2015-06-27 ENCOUNTER — Ambulatory Visit (INDEPENDENT_AMBULATORY_CARE_PROVIDER_SITE_OTHER): Payer: Self-pay | Admitting: Family Medicine

## 2015-06-27 VITALS — BP 112/80 | HR 98 | Temp 98.6°F | Resp 18 | Ht 62.0 in | Wt 160.0 lb

## 2015-06-27 DIAGNOSIS — L298 Other pruritus: Secondary | ICD-10-CM

## 2015-06-27 DIAGNOSIS — B373 Candidiasis of vulva and vagina: Secondary | ICD-10-CM

## 2015-06-27 DIAGNOSIS — M545 Low back pain, unspecified: Secondary | ICD-10-CM

## 2015-06-27 DIAGNOSIS — I7773 Dissection of renal artery: Secondary | ICD-10-CM

## 2015-06-27 DIAGNOSIS — N898 Other specified noninflammatory disorders of vagina: Secondary | ICD-10-CM

## 2015-06-27 DIAGNOSIS — B3731 Acute candidiasis of vulva and vagina: Secondary | ICD-10-CM

## 2015-06-27 DIAGNOSIS — N10 Acute tubulo-interstitial nephritis: Secondary | ICD-10-CM

## 2015-06-27 DIAGNOSIS — R3915 Urgency of urination: Secondary | ICD-10-CM

## 2015-06-27 LAB — COMPREHENSIVE METABOLIC PANEL
ALBUMIN: 4.3 g/dL (ref 3.6–5.1)
ALK PHOS: 66 U/L (ref 33–115)
ALT: 10 U/L (ref 6–29)
AST: 13 U/L (ref 10–30)
BILIRUBIN TOTAL: 0.5 mg/dL (ref 0.2–1.2)
BUN: 8 mg/dL (ref 7–25)
CALCIUM: 9.3 mg/dL (ref 8.6–10.2)
CO2: 28 mmol/L (ref 20–31)
Chloride: 100 mmol/L (ref 98–110)
Creat: 0.69 mg/dL (ref 0.50–1.10)
Glucose, Bld: 91 mg/dL (ref 65–99)
Potassium: 4.3 mmol/L (ref 3.5–5.3)
Sodium: 138 mmol/L (ref 135–146)
TOTAL PROTEIN: 7.2 g/dL (ref 6.1–8.1)

## 2015-06-27 LAB — POCT CBC
GRANULOCYTE PERCENT: 76 % (ref 37–80)
HEMATOCRIT: 42.6 % (ref 37.7–47.9)
Hemoglobin: 13.6 g/dL (ref 12.2–16.2)
LYMPH, POC: 2.5 (ref 0.6–3.4)
MCH, POC: 27.5 pg (ref 27–31.2)
MCHC: 31.8 g/dL (ref 31.8–35.4)
MCV: 86.2 fL (ref 80–97)
MID (CBC): 0.6 (ref 0–0.9)
MPV: 7.2 fL (ref 0–99.8)
POC GRANULOCYTE: 9.9 — AB (ref 2–6.9)
POC LYMPH %: 19.1 % (ref 10–50)
POC MID %: 4.9 % (ref 0–12)
Platelet Count, POC: 397 10*3/uL (ref 142–424)
RBC: 4.94 M/uL (ref 4.04–5.48)
RDW, POC: 13.2 %
WBC: 13 10*3/uL — AB (ref 4.6–10.2)

## 2015-06-27 LAB — POCT URINALYSIS DIPSTICK
BILIRUBIN UA: NEGATIVE
Glucose, UA: NEGATIVE
KETONES UA: NEGATIVE
Nitrite, UA: POSITIVE
PH UA: 5.5
Protein, UA: 100
Spec Grav, UA: 1.01
Urobilinogen, UA: 0.2

## 2015-06-27 LAB — POCT WET PREP WITH KOH
KOH Prep POC: NEGATIVE
Trichomonas, UA: NEGATIVE

## 2015-06-27 LAB — POCT UA - MICROSCOPIC ONLY
CRYSTALS, UR, HPF, POC: NEGATIVE
Mucus, UA: NEGATIVE
YEAST UA: NEGATIVE

## 2015-06-27 LAB — POCT URINE PREGNANCY: Preg Test, Ur: NEGATIVE

## 2015-06-27 MED ORDER — CIPROFLOXACIN HCL 500 MG PO TABS: 500.0000 mg | ORAL_TABLET | Freq: Two times a day (BID) | ORAL | Status: DC

## 2015-06-27 MED ORDER — FLUCONAZOLE 150 MG PO TABS: 150.0000 mg | ORAL_TABLET | Freq: Once | ORAL | Status: DC

## 2015-06-27 NOTE — Progress Notes (Signed)
[facility] [address], [place] [address] [id number]  Date:  06/27/2015   Name:  Brittany Gibson   DOB:  09/14/1971   MRN:  [id number]  PCP:  Nicki Reaper, NP    Chief Complaint: Chills; Vaginal Itching; Abdominal Pain; urge to urinate; Back Pain; and Nausea   History of Present Illness:  This is a 44 y.o. 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 Symptoms started with vaginal itching. Did use 1 day monostat OTC and itching got better but is now back. She has had some white vaginal discharge that is new. Back pain is intermittent, usually the right side, described as a dull ache. She had some abdominal pain last night that felt like gas but this is better today. She states she was passing gas frequently which would give her short term relief. She has been nauseated but no vomiting. Felt chilled but no fevers. Urine has looked amber. She is at the end of her period today. LMP 06/1215. She denies dysuria. She is sexually active with her husband of 22 years. She is not concerned for STDs. She had a UTI once before many years ago. She has never had a kidney stone or kidney infection. Of note, she had spontaneous left renal artery dissection in 2012. She states "1/3 of my left kidney died". She is under the care of a vascular specialist. Full work up for autoimmune disease negative. She has imaging every 2 years to monitor. She has been trying cranberry pills, hydration and a probiotic without much help.  Review of Systems:  Review of Systems See HPI  Patient Active Problem List   Diagnosis Date Noted  . Essential hypertension 06/07/2015  . 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 Authorizing Provider   aspirin EC 81 MG tablet Take 81 mg by mouth daily.   Yes Historical Provider, MD  fluticasone (FLONASE) 50 MCG/ACT nasal spray Place 2 sprays into both nostrils 2 (two) times daily. 11/13/14  Yes Ethelda Chick, MD  lisinopril (PRINIVIL,ZESTRIL) 10 MG tablet Take 1 tablet (10 mg total) by mouth daily. 06/07/15  Yes Raelyn Ensign, PA  Multiple Vitamin (MULTIVITAMIN WITH MINERALS) TABS tablet Take 2 tablets by mouth daily.   Yes Historical Provider, MD  omeprazole (PRILOSEC) 40 MG capsule Take 1 capsule (40 mg total) by mouth daily. 06/08/15  Yes Raelyn Ensign, PA                  Allergies  Allergen Reactions  . Iohexol Hives and Itching     Code: HIVES, Desc: itching eyes,mouth & vagina...new reaction...none prior,dr.barry suggests benadryl prior for next exam//a.c., Onset Date: [id number]   . Sulfa Antibiotics Rash  . Tetanus Toxoids Rash  . Tuberculin Tests Rash    Past Surgical History  Procedure Laterality Date  . Cesarean section    . Tubal ligation    . Arterigram      Social History  Substance Use Topics  . Smoking status: Former Smoker    Types: Cigarettes    Quit date: 11/10/1993  . Smokeless tobacco: Never Used  . Alcohol Use: No    Family History  Problem Relation Age of Onset  . Hypertension Mother   .  Diabetes Mother   . Hypertension Father   . Cancer Sister   . Cancer Maternal Grandmother   . Diabetes Paternal Grandmother   . Stroke Paternal Grandfather     Medication list has been reviewed and updated.  Physical Examination:  Physical Exam  Constitutional: She is oriented to person, place, and time. She appears well-developed and well-nourished. No distress.  HENT:  Head: Normocephalic and atraumatic.  Right Ear: Hearing normal.  Left Ear: Hearing normal.  Nose: Nose normal.  Mouth/Throat: Uvula is midline, oropharynx is clear and moist and mucous membranes are normal.  Eyes: Conjunctivae and lids are normal. Right eye exhibits no discharge. Left eye  exhibits no discharge. No scleral icterus.  Cardiovascular: Normal rate, regular rhythm, normal heart sounds and normal pulses.   No murmur heard. Pulmonary/Chest: Effort normal and breath sounds normal. No respiratory distress. She has no wheezes. She has no rhonchi. She has no rales.  Abdominal: Soft. Normal appearance. There is tenderness (mild suprapubic). There is CVA tenderness (mild, right).  Genitourinary: Uterus normal. There is no lesion on the right labia. There is no lesion on the left labia. Cervix exhibits no motion tenderness, no discharge and no friability. Right adnexum displays no tenderness and no fullness. Left adnexum displays no tenderness and no fullness. Vaginal discharge (large amount, white, clumpy) found.  Musculoskeletal: Normal range of motion.  Lymphadenopathy:       Head (right side): No submental, no submandibular and no tonsillar adenopathy present.       Head (left side): No submental, no submandibular and no tonsillar adenopathy present.    She has no cervical adenopathy.  Neurological: She is alert and oriented to person, place, and time.  Skin: Skin is warm, dry and intact. No lesion and no rash noted.  Psychiatric: She has a normal mood and affect. Her speech is normal and behavior is normal. Thought content normal.   BP 112/80 mmHg  Pulse 98  Temp(Src) 98.6 F (37 C) (Oral)  Resp 18  Ht 5\' 2"  (1.575 m)  Wt 160 lb (72.576 kg)  BMI 29.26 kg/m2  SpO2 99%  LMP 06/22/2015  Results for orders placed or performed in visit on 06/27/15  POCT urinalysis dipstick  Result Value Ref Range   Color, UA Amber    Clarity, UA Turbid    Glucose, UA Negative    Bilirubin, UA Negative    Ketones, UA Negative    Spec Grav, UA 1.010    Blood, UA Large    pH, UA 5.5    Protein, UA 100    Urobilinogen, UA 0.2    Nitrite, UA Positive    Leukocytes, UA Trace (A) Negative  POCT UA - Microscopic Only  Result Value Ref Range   WBC, Ur, HPF, POC 2-5    RBC, urine,  microscopic TNTC    Bacteria, U Microscopic 3+    Mucus, UA Negative    Epithelial cells, urine per micros 0-2    Crystals, Ur, HPF, POC Negative    Casts, Ur, LPF, POC Hyphae    Yeast, UA Negative   POCT CBC  Result Value Ref Range   WBC 13.0 (A) 4.6 - 10.2 K/uL   Lymph, poc 2.5 0.6 - 3.4   POC LYMPH PERCENT 19.1 10 - 50 %L   MID (cbc) 0.6 0 - 0.9   POC MID % 4.9 0 - 12 %M   POC Granulocyte 9.9 (A) 2 - 6.9   Granulocyte percent 76.0 37 -  80 %G   RBC 4.94 4.04 - 5.48 M/uL   Hemoglobin 13.6 12.2 - 16.2 g/dL   HCT, POC 16.1 09.6 - 47.9 %   MCV 86.2 80 - 97 fL   MCH, POC 27.5 27 - 31.2 pg   MCHC 31.8 31.8 - 35.4 g/dL   RDW, POC 04.5 %   Platelet Count, POC 397.0 142 - 424 K/uL   MPV 7.2 0 - 99.8 fL  POCT Wet Prep with KOH  Result Value Ref Range   Trichomonas, UA Negative    Clue Cells Wet Prep HPF POC Moderate    Epithelial Wet Prep HPF POC Many Few, Moderate, Many   Yeast Wet Prep HPF POC trace    Bacteria Wet Prep HPF POC Many (A) None, Few   RBC Wet Prep HPF POC TNTC    WBC Wet Prep HPF POC Few    KOH Prep POC Negative   POCT urine pregnancy  Result Value Ref Range   Preg Test, Ur Negative Negative    Assessment and Plan:  1. Acute pyelonephritis 2. Urgency of urination 3. Right sided low back pain without sciatica 4. Yeast vaginitis 5. Vaginal itching 6. Renal artery dissection Will treat for early pyelo. Urine culture pending. CBC with mildly elevated wbc to 13. Rocephin not given in office today as she does not have insurance. Will also treat for yeast infection with diflucan. She did have moderate clues on wet prep but will hold off on treating for BV since sx and discharge consistent with yeast. If vaginal sx continue in one week would consider calling in metrogel. Pt does have a history of spontaneous left renal artery dissection -- kidney function normal on CMP. She will call in 48 hours and let me know how she is doing. If not significantly better would  consider sending for imaging studies of the kidneys. Discussed return precautions. - Urine culture - ciprofloxacin (CIPRO) 500 MG tablet; Take 1 tablet (500 mg total) by mouth 2 (two) times daily.  Dispense: 20 tablet; Refill: 0 - POCT urinalysis dipstick - POCT UA - Microscopic Only - POCT CBC - POCT urine pregnancy - Comprehensive metabolic panel - fluconazole (DIFLUCAN) 150 MG tablet; Take 1 tablet (150 mg total) by mouth once. Repeat if needed  Dispense: 2 tablet; Refill: 0 - POCT Wet Prep with KOH   Roswell Miners. Dyke Brackett, MHS [facility]  06/27/2015

## 2015-06-27 NOTE — Patient Instructions (Signed)
Take cipro twice a day for 10 days. I will call you with the results of your urine culture and blood tests. Take diflucan today and repeat in 48 hours. If you are worse tomorrow, call me. Call the office in 48 hours and let me know how you are doing. If your vaginal itching is not improved in 1 week, let me know and we will treat for bacterial vaginosis. Drink at least 64 oz of water a day.  Return in October for your vaccines.  Bacterial Vaginosis Bacterial vaginosis is a vaginal infection that occurs when the normal balance of bacteria in the vagina is disrupted. It results from an overgrowth of certain bacteria. This is the most common vaginal infection in women of childbearing age. Treatment is important to prevent complications, especially in pregnant women, as it can cause a premature delivery. CAUSES  Bacterial vaginosis is caused by an increase in harmful bacteria that are normally present in smaller amounts in the vagina. Several different kinds of bacteria can cause bacterial vaginosis. However, the reason that the condition develops is not fully understood. RISK FACTORS Certain activities or behaviors can put you at an increased risk of developing bacterial vaginosis, including:  Having a new sex partner or multiple sex partners.  Douching.  Using an intrauterine device (IUD) for contraception. Women do not get bacterial vaginosis from toilet seats, bedding, swimming pools, or contact with objects around them. SIGNS AND SYMPTOMS  Some women with bacterial vaginosis have no signs or symptoms. Common symptoms include:  Grey vaginal discharge.  A fishlike odor with discharge, especially after sexual intercourse.  Itching or burning of the vagina and vulva.  Burning or pain with urination. DIAGNOSIS  Your health care provider will take a medical history and examine the vagina for signs of bacterial vaginosis. A sample of vaginal fluid may be taken. Your health care provider  will look at this sample under a microscope to check for bacteria and abnormal cells. A vaginal pH test may also be done.  TREATMENT  Bacterial vaginosis may be treated with antibiotic medicines. These may be given in the form of a pill or a vaginal cream. A second round of antibiotics may be prescribed if the condition comes back after treatment.  HOME CARE INSTRUCTIONS   Only take over-the-counter or prescription medicines as directed by your health care provider.  If antibiotic medicine was prescribed, take it as directed. Make sure you finish it even if you start to feel better.  Do not have sex until treatment is completed.  Tell all sexual partners that you have a vaginal infection. They should see their health care provider and be treated if they have problems, such as a mild rash or itching.  Practice safe sex by using condoms and only having one sex partner. SEEK MEDICAL CARE IF:   Your symptoms are not improving after 3 days of treatment.  You have increased discharge or pain.  You have a fever. MAKE SURE YOU:   Understand these instructions.  Will watch your condition.  Will get help right away if you are not doing well or get worse. FOR MORE INFORMATION  Centers for Disease Control and Prevention, Division of STD Prevention: SolutionApps.[employer].za American Sexual Health Association (ASHA): www.ashastd.org  Document Released: 10/27/2005 Document Revised: 08/17/2013 Document Reviewed: 06/08/2013 [facility] Patient Information 2015 [place], [place]. This information is not intended to replace advice given to you by your health care provider. Make sure you discuss any questions you have with your  health care provider.  

## 2015-06-29 ENCOUNTER — Telehealth: Payer: Self-pay

## 2015-06-29 ENCOUNTER — Other Ambulatory Visit: Payer: Self-pay | Admitting: Physician Assistant

## 2015-06-29 LAB — URINE CULTURE: Colony Count: >=100000

## 2015-06-29 MED ORDER — NITROFURANTOIN MONOHYD MACRO 100 MG PO CAPS: 100.0000 mg | ORAL_CAPSULE | Freq: Two times a day (BID) | ORAL | Status: DC

## 2015-06-29 NOTE — Telephone Encounter (Signed)
Spoke to patient.  Advised her that since she is having so many sx PA doubts they are due to Cipro, but did change the medicine due the the dizziness.  If sx return please RTC or go to nearest ED.

## 2015-06-29 NOTE — Telephone Encounter (Signed)
Pt called. After starting Cipro on Wed her eye. Thurs morning-started getting dizzy after taking Cipro, No rash, but still Itching. Now having right leg paresthesia. Last evening-right hip pain and left shoulder pain. Sharp HA's.This am left shoulder pain and continued itching  Advised pt to D/C Cipro. Can we send in another abx? Looks like her U. Cx is growing out E Coli

## 2015-06-29 NOTE — Telephone Encounter (Signed)
PT called requesting that Rx is sent to the [facility] pharmacy on [address]. Please contact her at [phone] phone will be off while at work so please leave VM when this action has been taken.  [phone]

## 2015-06-29 NOTE — Telephone Encounter (Signed)
Nitrofurantoin sent to pharmacy. These are quite a lot of symptoms and I doubt that Cipro is the cause, however will change given her report of dizziness associated with Cipro use. Please advise that patient return to clinic if her symptoms continue, or that she may go to the nearest ED.  Deliah Boston, MS, PA-C   9:15 AM, 06/29/2015

## 2015-07-06 NOTE — Telephone Encounter (Signed)
Patient is returning a missed phone call from lab. She states it's best to reach her after 3:30

## 2015-08-30 ENCOUNTER — Other Ambulatory Visit: Payer: Self-pay | Admitting: Obstetrics and Gynecology

## 2015-08-30 DIAGNOSIS — N63 Unspecified lump in unspecified breast: Secondary | ICD-10-CM

## 2015-09-05 ENCOUNTER — Ambulatory Visit
Admission: RE | Admit: 2015-09-05 | Discharge: 2015-09-05 | Disposition: A | Discharge status: Home / Self Care | Payer: 59 | Source: Ambulatory Visit | Attending: Obstetrics and Gynecology | Admitting: Obstetrics and Gynecology

## 2015-09-05 DIAGNOSIS — N63 Unspecified lump in unspecified breast: Secondary | ICD-10-CM

## 2015-10-09 ENCOUNTER — Telehealth: Payer: Self-pay

## 2015-10-09 NOTE — Telephone Encounter (Signed)
So it appears that she needed to return hep b series, and mmr--as her labs were abnormal.  She was told to return for these labs.  But i put that in the recommendations and will let her school handle this.  I can not pull up the hearing and screening.  Please fill out the remainder so I can sign.  (hearing, vision).    

## 2015-10-09 NOTE — Telephone Encounter (Signed)
Pt had left a physical form to be filled out when she saw Tawanna Coolerodd for her CPE on July 28.  The form is gone and no one ever called her about it.  I see no other notes about it either.  Please have someone fill this out asap and call her to pick it up.  I have left the form in the nurses box.  [phone]

## 2015-10-11 NOTE — Telephone Encounter (Signed)
Left message form ready to pick up

## 2015-10-12 ENCOUNTER — Telehealth: Payer: Self-pay

## 2015-10-12 NOTE — Telephone Encounter (Signed)
Form faxed with successful confirmation.  °

## 2015-10-12 NOTE — Telephone Encounter (Signed)
Patient called and states that she needs her form faxed to [phone] attention Scheryl DarterVivian Wilder. Please see previous phone messages about this. I will just fax this form when I come back from lunch around 1:00pm.  [phone]

## 2015-10-12 NOTE — Telephone Encounter (Signed)
Patient called and states that she needs her form faxed to [phone] attention Vivian Wilder. Please see previous phone messages about this. I will just fax this form when I come back from lunch around 1:00pm. ° °[phone] ° °

## 2015-12-06 ENCOUNTER — Other Ambulatory Visit: Payer: Self-pay | Admitting: Obstetrics and Gynecology

## 2015-12-07 LAB — CYTOLOGY - PAP

## 2015-12-10 ENCOUNTER — Other Ambulatory Visit: Payer: Self-pay | Admitting: Physician Assistant

## 2015-12-11 ENCOUNTER — Other Ambulatory Visit: Payer: Self-pay | Admitting: Physician Assistant

## 2016-01-02 IMAGING — MR MR MRA ABDOMEN W/ OR W/O CM
4 of 10 series · 19 of 48 positions shown · IV contrast (Yes)
Comparison: 07/29/2013 and earlier studies

CLINICAL DATA: eval for right renal dissection. Severe right flank
pain. History of left renal artery dissection.

EXAM:
MRA ABDOMEN AND PELVIS WITH CONTRAST
TECHNIQUE: Multiplanar, multiecho pulse sequences of the abdomen and pelvis
were obtained with intravenous contrast. Angiographic images of
abdomen and pelvis were obtained using MRA technique with
intravenous contrast.
CONTRAST:  15mL MULTIHANCE GADOBENATE DIMEGLUMINE 529 MG/ML IV SOLN

[Series 4: T2 · axial · 8.0mm · 0.78mm/px · z∈[-233,+82]mm · 3 of 36 slices shown]
[im 1/36]
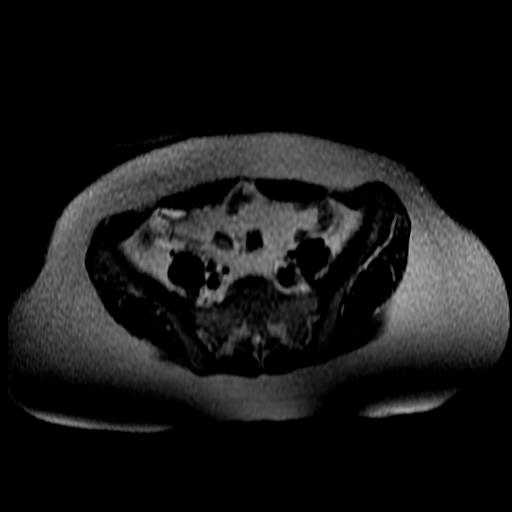
[im 18/36]
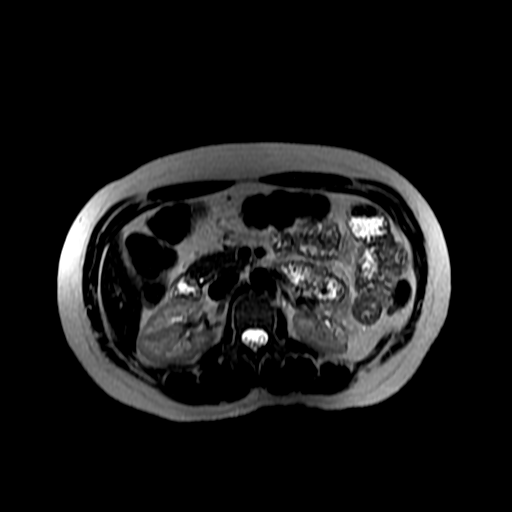
[im 36/36]
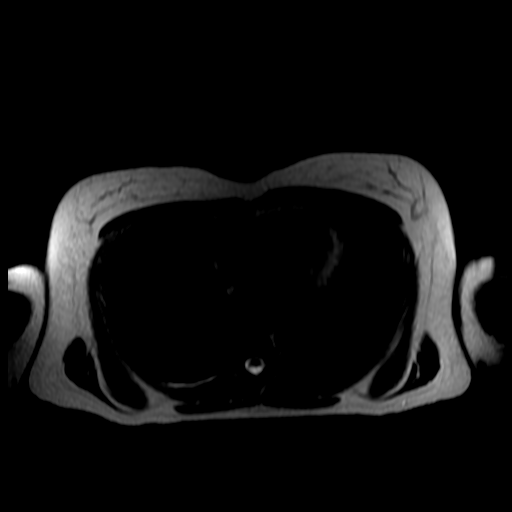

[Series 5: (id) · axial · 2.0mm · 0.70mm/px · z∈[-164,-37]mm · 8 of 128 slices shown]
[im 1/128]
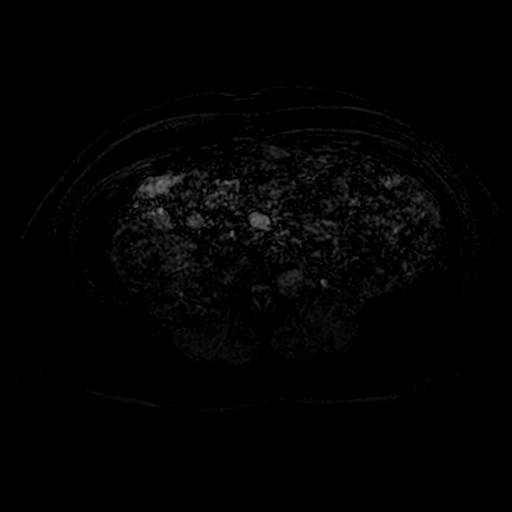
[im 19/128]
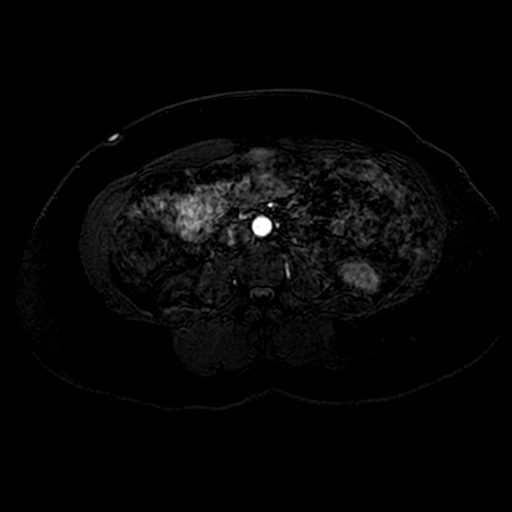
[im 37/128]
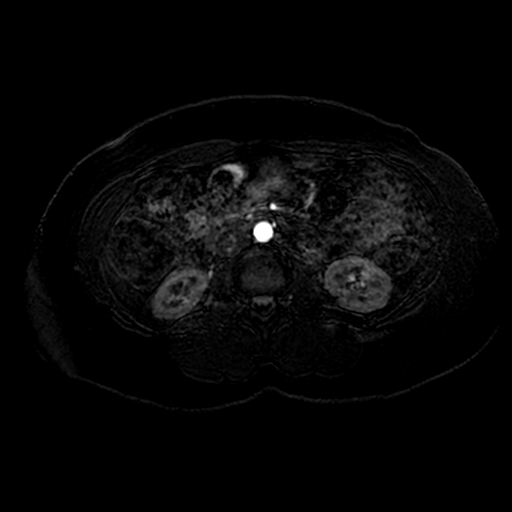
[im 55/128]
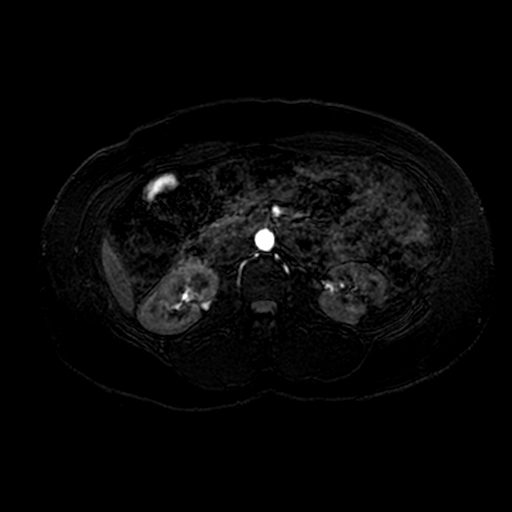
[im 73/128]
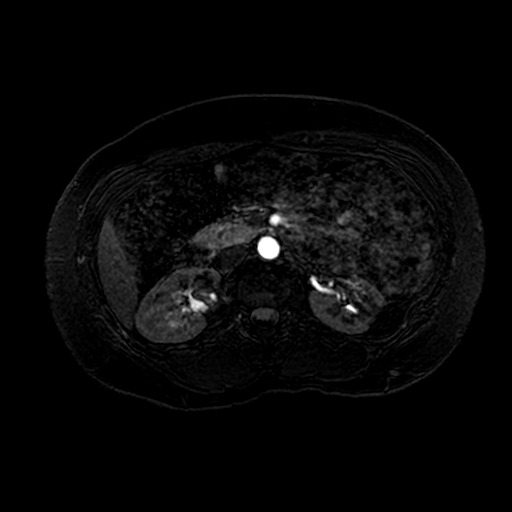
[im 91/128]
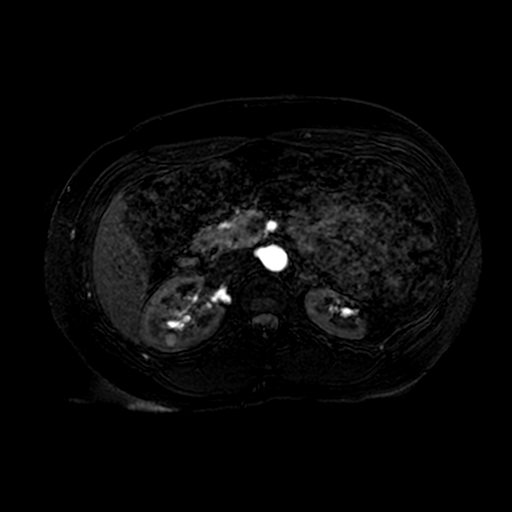
[im 109/128]
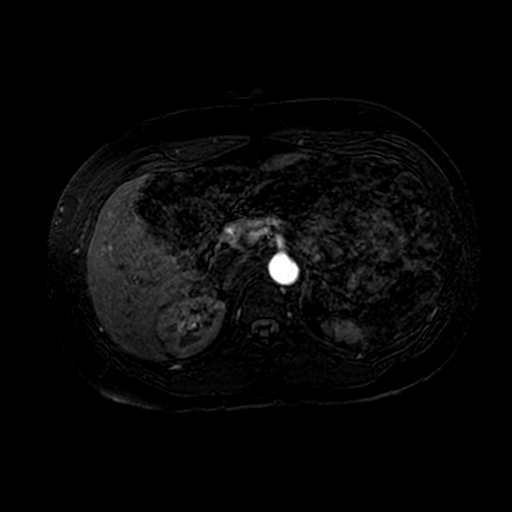
[im 128/128]
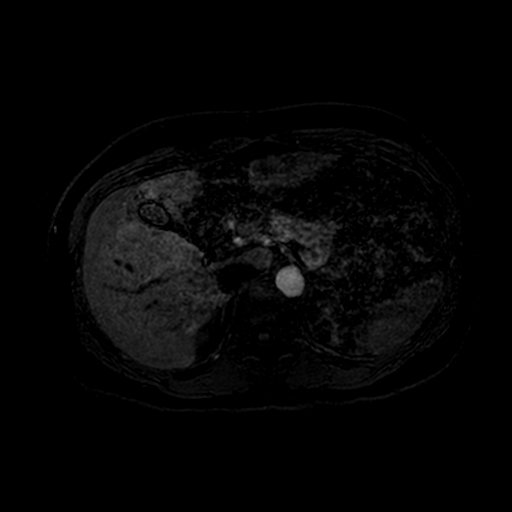

[Series 8: T1 dynamic post-contrast · axial · 5.0mm · 0.78mm/px · z∈[-186,+31]mm · 5 of 88 slices shown]
[im 1/88]
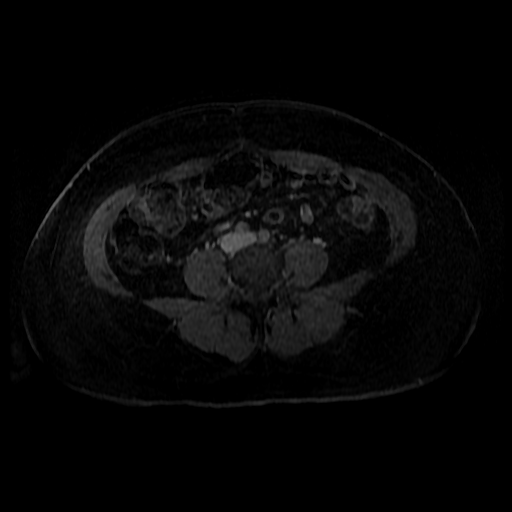
[im 22/88]
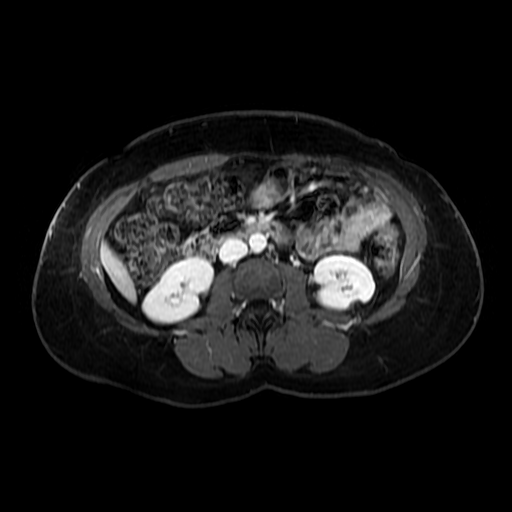
[im 44/88]
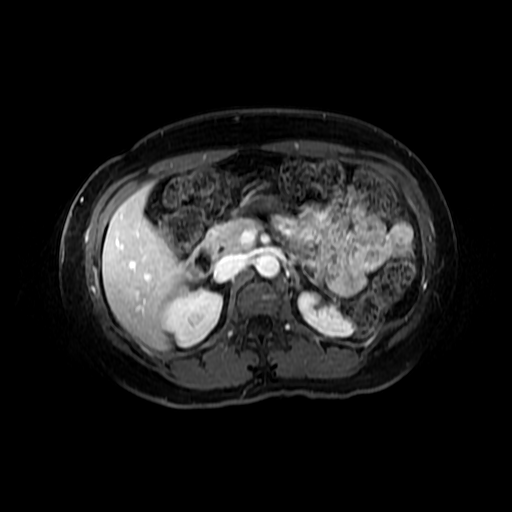
[im 66/88]
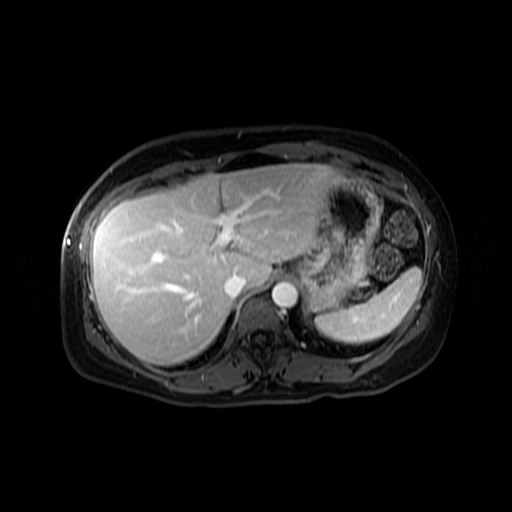
[im 88/88]
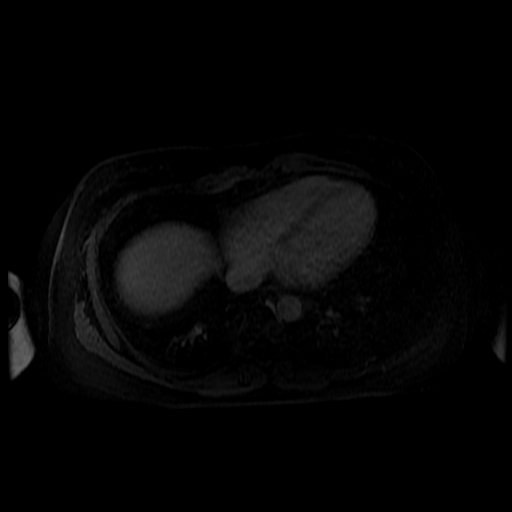

[Series 9: T1 dynamic · coronal · 5.0mm · 0.86mm/px · 3 of 92 slices shown]
[im 1/92]
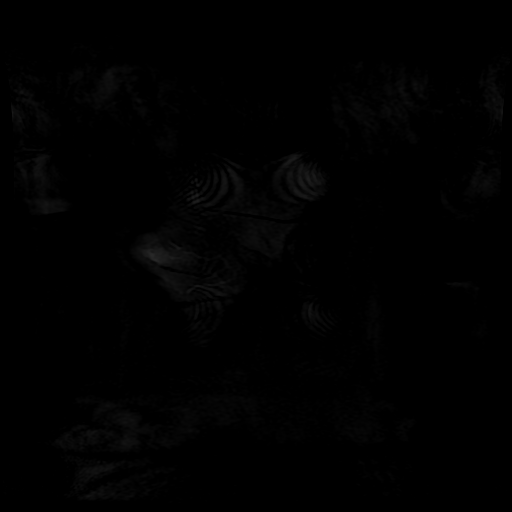
[im 46/92]
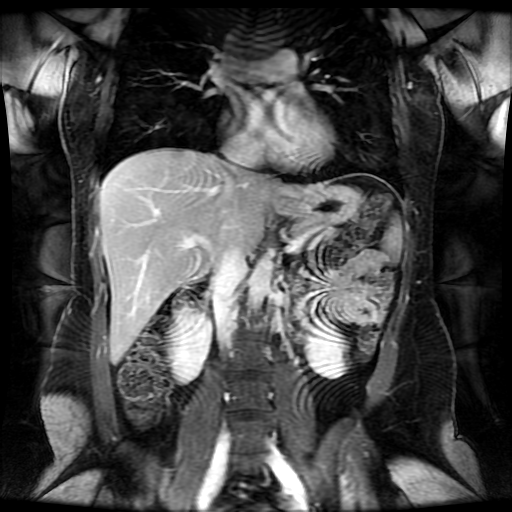
[im 92/92]
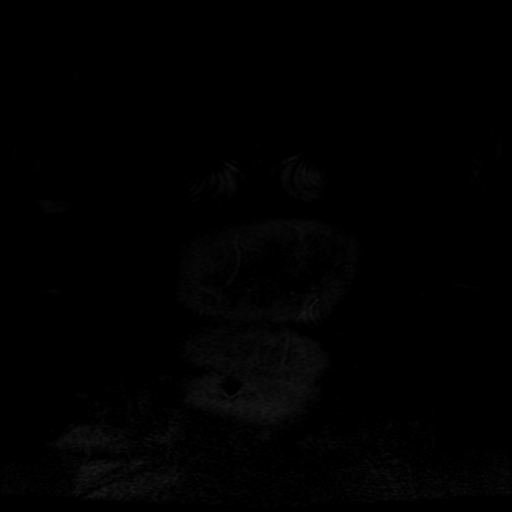

[19 of 48 positions shown; findings below may reference images not displayed]

FINDINGS: Arterial findings:

Aorta: No dissection, aneurysm, or stenosis. No significant
atheromatous irregularity.

Celiac axis:         Widely patent

Superior mesenteric: Widely patent, classic distal branch anatomy.

Left renal: Single. Tapered narrowing in the distal main left renal
artery as before consistent with previously demonstrated dissection,
with no high-grade stenosis.

Right renal:         Single, widely patent.

Inferior mesenteric: Patent

Left iliac: Common iliac artery unremarkable through its
bifurcation.

Right iliac: Common iliac unremarkable through its bifurcation.

Venous findings: Patent hepatic veins, portal vein, superior
mesenteric vein, splenic vein, bilateral renal veins, IVC.

Review of the MIP images confirms the above findings.

Nonvascular findings: Unremarkable limited assessment of liver,
nondilated gallbladder, spleen, adrenal glands, pancreas. Focal
areas of parenchymal loss in the left kidney as before. Otherwise
homogeneous renal parenchymal enhancement. Small midpole right renal
cyst stable.
IMPRESSION: 1. Stable appearance of dissection in the distal left main renal
artery with stable renal parenchymal loss.
2. No acute or superimposed abnormality identified.

## 2016-01-14 ENCOUNTER — Other Ambulatory Visit: Payer: Self-pay | Admitting: Physician Assistant

## 2016-01-18 ENCOUNTER — Telehealth: Payer: Self-pay

## 2016-01-18 DIAGNOSIS — I7773 Dissection of renal artery: Secondary | ICD-10-CM

## 2016-01-18 DIAGNOSIS — I7771 Dissection of carotid artery: Secondary | ICD-10-CM

## 2016-01-18 DIAGNOSIS — Z5189 Encounter for other specified aftercare: Secondary | ICD-10-CM

## 2016-01-18 NOTE — Telephone Encounter (Signed)
Discussed pt's symptoms with Dr. Imogene Burnhen.  Advised that she would need to go to the ER if her pain worsens, and to schedule her surveillance studies per recommendation of Dr. Myra GianottiBrabham.  Attempted to call pt. Back.  Left voice message to go to the ER if her pain symptoms worsen.  Advised that nurse will talk to Dr. Myra GianottiBrabham on 3/13 re: scheduling the next surveillance MRA, and call pt. with recommendation.

## 2016-01-18 NOTE — Telephone Encounter (Signed)
Phone call from pt.  Reported "sudden onset of pain last night", in center of anterior neck.  Reported "the pain is intermittent, and a sharp-stabbing pain."  Pt. describes an intermittent feeling of a fullness in same area.  Denied any recent injury to neck.  Reported she works out, and has recently started lifting 10 lb weights above head, over past 2 weeks, but stated she hasn't been lifting the weights the past 2 days.  Pt. @ the company nurse office during this phone call, and reported BP 136/90, P 96., at this time.  Will discuss with MD in office today.

## 2016-01-21 NOTE — Telephone Encounter (Signed)
Spoke with Dr. Myra GianottiBrabham re: pt's c/o anterior neck pain that is intermittent.  Questioned if pt. is having any local symptoms of numbness or weakness of upper extremities.  Advised if no local symptoms, then pt. should f/u with the MRA of head, neck, abd. and pelvis at the regular surveillance interval.  Phone call to pt.  Questioned about any local symptoms of numbness or weakness of upper extremities.  Denied any upper extremity numbness or weakness.  Reported she has had left shoulder pain, and went on to explain she has a "chipped bone in left shoulder."  Stated when she saw the school nurse recently, was told she had some elevation in BP and pulse.  Questioned about early signs of a heart attack.  Then stated she has had a fullness in the esophagus, and feels like she is having digestive problems.  Pt. reported she has an appt. with her GI MD coming up, and will discuss symptoms with him.  Requested to go ahead and schedule her MRA and f/u with Dr. Myra GianottiBrabham;  Stated she did post pone the surveillance appt. last summer, because she was feeling better.  Advised to contact her PCP re: the fullness in esophagus, and left shoulder pain, due to poss. Of cardiac involvement.  Advised will have a scheduler to contact her with the appts. For MRA and f/u with Dr. Myra GianottiBrabham.  Verb. Understanding of recommendations.

## 2016-01-23 ENCOUNTER — Other Ambulatory Visit: Payer: Self-pay

## 2016-01-23 DIAGNOSIS — I7773 Dissection of renal artery: Secondary | ICD-10-CM

## 2016-01-23 DIAGNOSIS — I7771 Dissection of carotid artery: Secondary | ICD-10-CM

## 2016-01-23 DIAGNOSIS — I720 Aneurysm of carotid artery: Secondary | ICD-10-CM

## 2016-01-23 NOTE — Telephone Encounter (Signed)
[facility] stating that the order for the MRA Head needed to be changed to MRA Head without- Can someone please look into this order?  The appointment is being delayed until the order is completed.   Thank you! Annabelle Harmanana

## 2016-01-23 NOTE — Telephone Encounter (Signed)
Orders corrected; added MRA Head wo contrast; added MRA Abd w/ wo contrast; added MRA Pelvis w/ wo contrast for future appt.

## 2016-01-31 ENCOUNTER — Other Ambulatory Visit: Payer: Self-pay | Admitting: Gastroenterology

## 2016-02-01 ENCOUNTER — Encounter: Payer: Self-pay | Admitting: Surgery

## 2016-02-05 ENCOUNTER — Ambulatory Visit
Admission: RE | Admit: 2016-02-05 | Discharge: 2016-02-05 | Disposition: A | Discharge status: Home / Self Care | Payer: 59 | Source: Ambulatory Visit | Attending: Surgery | Admitting: Surgery

## 2016-02-05 DIAGNOSIS — I7773 Dissection of renal artery: Secondary | ICD-10-CM

## 2016-02-05 MED ORDER — GADOBENATE DIMEGLUMINE 529 MG/ML IV SOLN
15.0000 mL | Freq: Once | INTRAVENOUS | Status: AC | PRN
  Administered 2016-02-05: 15 mL via INTRAVENOUS

## 2016-02-07 ENCOUNTER — Inpatient Hospital Stay: Admission: RE | Admit: 2016-02-07 | Payer: 59 | Source: Ambulatory Visit

## 2016-02-07 ENCOUNTER — Other Ambulatory Visit: Payer: 59

## 2016-02-11 ENCOUNTER — Ambulatory Visit: Payer: 59 | Admitting: Surgery

## 2017-07-07 ENCOUNTER — Telehealth: Payer: Self-pay | Admitting: *Deleted

## 2017-07-07 ENCOUNTER — Encounter: Payer: Self-pay | Admitting: *Deleted

## 2017-07-07 DIAGNOSIS — I720 Aneurysm of carotid artery: Secondary | ICD-10-CM

## 2017-07-07 DIAGNOSIS — G441 Vascular headache, not elsewhere classified: Secondary | ICD-10-CM

## 2017-07-07 NOTE — Telephone Encounter (Signed)
Dr. Randie Heinz spoke to this patient after she had called in to our answering service regarding headaches and visual disturbances. He has discussed this with Dr. Myra Gianotti, who is her regular doctor here at [facility], and they wish patient to have MRA head and neck for evaluation. She will also need a follow up appt with Dr. Myra Gianotti only per Dr. Randie Heinz.

## 2017-07-10 ENCOUNTER — Telehealth: Payer: Self-pay | Admitting: Vascular Surgery

## 2017-07-10 NOTE — Telephone Encounter (Signed)
-----   Message from Sharee PimpleMarilyn K McChesney, RN sent at 07/07/2017 10:50 AM EDT ----- Regarding: Needs MRA Head and Neck, appt with [facility]  Please refer to my phone message. [facility] per Dr. Randie Heinzain.

## 2017-07-10 NOTE — Telephone Encounter (Signed)
[facility]. Sched MD 07/24/17 at 9:30. Lm on cell#.

## 2017-07-19 ENCOUNTER — Other Ambulatory Visit: Payer: 59

## 2017-07-24 ENCOUNTER — Ambulatory Visit: Payer: 59 | Admitting: Vascular Surgery

## 2017-08-10 ENCOUNTER — Telehealth: Payer: Self-pay | Admitting: *Deleted

## 2017-08-10 NOTE — Telephone Encounter (Signed)
Patient called stating that she awakened with pressure over her right eye and tingling.  She states that it had subsided somewhat and she is working. She had experienced the same symptoms last week and did not know if it was a migraine or symptoms from her carotid artery.  She states that her insurance requires an office visit before any scan.  An appointment was scheduled with the NP for 08/14/2017.  Patient confirmed that if symptoms should worsen she will report to the ED.

## 2017-08-12 ENCOUNTER — Encounter: Payer: Self-pay | Admitting: Family

## 2017-08-14 ENCOUNTER — Ambulatory Visit (INDEPENDENT_AMBULATORY_CARE_PROVIDER_SITE_OTHER): Payer: 59 | Admitting: Family

## 2017-08-14 ENCOUNTER — Encounter: Payer: Self-pay | Admitting: Family

## 2017-08-14 VITALS — BP 123/90 | HR 78 | Temp 98.3°F | Resp 16 | Ht 62.0 in | Wt 177.0 lb

## 2017-08-14 DIAGNOSIS — H579 Unspecified disorder of eye and adnexa: Secondary | ICD-10-CM | POA: Diagnosis not present

## 2017-08-14 DIAGNOSIS — I7773 Dissection of renal artery: Secondary | ICD-10-CM | POA: Diagnosis not present

## 2017-08-14 DIAGNOSIS — I7771 Dissection of carotid artery: Secondary | ICD-10-CM | POA: Diagnosis not present

## 2017-08-14 DIAGNOSIS — R202 Paresthesia of skin: Secondary | ICD-10-CM

## 2017-08-14 DIAGNOSIS — R200 Anesthesia of skin: Secondary | ICD-10-CM | POA: Diagnosis not present
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
## 2017-08-14 NOTE — Patient Instructions (Signed)
Stroke Prevention Some medical conditions and behaviors are associated with an increased chance of having a stroke. You may prevent a stroke by making healthy choices and managing medical conditions. How can I reduce my risk of having a stroke?  Stay physically active. Get at least 30 minutes of activity on most or all days.  Do not smoke. It may also be helpful to avoid exposure to secondhand smoke.  Limit alcohol use. Moderate alcohol use is considered to be:  No more than 2 drinks per day for men.  No more than 1 drink per day for nonpregnant women.  Eat healthy foods. This involves:  Eating 5 or more servings of fruits and vegetables a day.  Making dietary changes that address high blood pressure (hypertension), high cholesterol, diabetes, or obesity.  Manage your cholesterol levels.  Making food choices that are high in fiber and low in saturated fat, trans fat, and cholesterol may control cholesterol levels.  Take any prescribed medicines to control cholesterol as directed by your health care provider.  Manage your diabetes.  Controlling your carbohydrate and sugar intake is recommended to manage diabetes.  Take any prescribed medicines to control diabetes as directed by your health care provider.  Control your hypertension.  Making food choices that are low in salt (sodium), saturated fat, trans fat, and cholesterol is recommended to manage hypertension.  Ask your health care provider if you need treatment to lower your blood pressure. Take any prescribed medicines to control hypertension as directed by your health care provider.  If you are 18-39 years of age, have your blood pressure checked every 3-5 years. If you are 40 years of age or older, have your blood pressure checked every year.  Maintain a healthy weight.  Reducing calorie intake and making food choices that are low in sodium, saturated fat, trans fat, and cholesterol are recommended to manage  weight.  Stop drug abuse.  Avoid taking birth control pills.  Talk to your health care provider about the risks of taking birth control pills if you are over 35 years old, smoke, get migraines, or have ever had a blood clot.  Get evaluated for sleep disorders (sleep apnea).  Talk to your health care provider about getting a sleep evaluation if you snore a lot or have excessive sleepiness.  Take medicines only as directed by your health care provider.  For some people, aspirin or blood thinners (anticoagulants) are helpful in reducing the risk of forming abnormal blood clots that can lead to stroke. If you have the irregular heart rhythm of atrial fibrillation, you should be on a blood thinner unless there is a good reason you cannot take them.  Understand all your medicine instructions.  Make sure that other conditions (such as anemia or atherosclerosis) are addressed. Get help right away if:  You have sudden weakness or numbness of the face, arm, or leg, especially on one side of the body.  Your face or eyelid droops to one side.  You have sudden confusion.  You have trouble speaking (aphasia) or understanding.  You have sudden trouble seeing in one or both eyes.  You have sudden trouble walking.  You have dizziness.  You have a loss of balance or coordination.  You have a sudden, severe headache with no known cause.  You have new chest pain or an irregular heartbeat. Any of these symptoms may represent a serious problem that is an emergency. Do not wait to see if the symptoms will go away.   Get medical help at once. Call your local emergency services (911 in [place] Do not drive yourself to the hospital. This information is not intended to replace advice given to you by your health care provider. Make sure you discuss any questions you have with your health care provider. Document Released: 12/04/2004 Document Revised: 04/03/2016 Document Reviewed: 04/29/2013 Elsevier  Interactive Patient Education  2017 Elsevier Inc.  

## 2017-08-26 ENCOUNTER — Ambulatory Visit
Admission: RE | Admit: 2017-08-26 | Discharge: 2017-08-26 | Disposition: A | Discharge status: Home / Self Care | Payer: 59 | Source: Ambulatory Visit | Attending: Surgery | Admitting: Surgery

## 2017-08-26 DIAGNOSIS — R200 Anesthesia of skin: Secondary | ICD-10-CM

## 2017-08-26 DIAGNOSIS — R202 Paresthesia of skin: Secondary | ICD-10-CM

## 2017-08-26 DIAGNOSIS — H579 Unspecified disorder of eye and adnexa: Secondary | ICD-10-CM

## 2017-08-26 DIAGNOSIS — I7771 Dissection of carotid artery: Secondary | ICD-10-CM

## 2017-08-26 MED ORDER — GADOBENATE DIMEGLUMINE 529 MG/ML IV SOLN
17.0000 mL | Freq: Once | INTRAVENOUS | Status: AC | PRN
Start: 2017-08-26 — End: 2017-08-26
  Administered 2017-08-26: 17 mL via INTRAVENOUS

## 2017-08-31 ENCOUNTER — Encounter: Payer: Self-pay | Admitting: Surgery

## 2017-08-31 ENCOUNTER — Ambulatory Visit (INDEPENDENT_AMBULATORY_CARE_PROVIDER_SITE_OTHER): Payer: 59 | Admitting: Surgery

## 2017-08-31 VITALS — BP 121/92 | HR 96 | Temp 98.0°F | Resp 20 | Ht 62.0 in | Wt 177.0 lb

## 2017-08-31 DIAGNOSIS — R202 Paresthesia of skin: Secondary | ICD-10-CM | POA: Diagnosis not present

## 2017-08-31 DIAGNOSIS — R200 Anesthesia of skin: Secondary | ICD-10-CM | POA: Diagnosis not present
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
## 2018-04-12 ENCOUNTER — Telehealth: Payer: Self-pay | Admitting: *Deleted

## 2018-04-12 NOTE — Telephone Encounter (Signed)
Attempted(x2) to call patient back regarding her message left on triage line. No answer unable to leave msg. mailbox full.

## 2018-06-17 ENCOUNTER — Other Ambulatory Visit: Payer: Self-pay | Admitting: Internal Medicine

## 2018-06-17 ENCOUNTER — Ambulatory Visit
Admission: RE | Admit: 2018-06-17 | Discharge: 2018-06-17 | Disposition: A | Discharge status: Home / Self Care | Payer: 59 | Source: Ambulatory Visit | Attending: Internal Medicine | Admitting: Internal Medicine

## 2018-06-17 DIAGNOSIS — R0781 Pleurodynia: Secondary | ICD-10-CM

## 2018-11-19 ENCOUNTER — Other Ambulatory Visit: Payer: Self-pay

## 2018-11-19 ENCOUNTER — Emergency Department (HOSPITAL_COMMUNITY)
Admission: EM | Admit: 2018-11-19 | Discharge: 2018-11-20 | Discharge status: Against Medical Advice | Payer: 59 | Attending: Emergency Medicine | Admitting: Emergency Medicine

## 2018-11-19 DIAGNOSIS — Z5321 Procedure and treatment not carried out due to patient leaving prior to being seen by health care provider: Secondary | ICD-10-CM | POA: Diagnosis not present

## 2018-11-19 DIAGNOSIS — Z041 Encounter for examination and observation following transport accident: Secondary | ICD-10-CM | POA: Diagnosis present
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
## 2018-11-20 NOTE — ED Notes (Signed)
Pt leaving AMA. Pt states to make sure it's noted she never saw a doctor

## 2018-11-24 ENCOUNTER — Emergency Department (HOSPITAL_COMMUNITY): Payer: Managed Care, Other (non HMO)

## 2018-11-24 ENCOUNTER — Encounter (HOSPITAL_COMMUNITY): Payer: Self-pay | Admitting: Emergency Medicine

## 2018-11-24 ENCOUNTER — Emergency Department (HOSPITAL_COMMUNITY)
Admission: EM | Admit: 2018-11-24 | Discharge: 2018-11-24 | Disposition: A | Discharge status: Home / Self Care | Payer: Managed Care, Other (non HMO) | Attending: Emergency Medicine | Admitting: Emergency Medicine

## 2018-11-24 ENCOUNTER — Other Ambulatory Visit: Payer: Self-pay

## 2018-11-24 DIAGNOSIS — Y999 Unspecified external cause status: Secondary | ICD-10-CM | POA: Insufficient documentation

## 2018-11-24 DIAGNOSIS — I10 Essential (primary) hypertension: Secondary | ICD-10-CM | POA: Diagnosis not present

## 2018-11-24 DIAGNOSIS — S060X0A Concussion without loss of consciousness, initial encounter: Secondary | ICD-10-CM | POA: Insufficient documentation

## 2018-11-24 DIAGNOSIS — Z7982 Long term (current) use of aspirin: Secondary | ICD-10-CM | POA: Insufficient documentation

## 2018-11-24 DIAGNOSIS — Z8679 Personal history of other diseases of the circulatory system: Secondary | ICD-10-CM | POA: Diagnosis not present

## 2018-11-24 DIAGNOSIS — H53149 Visual discomfort, unspecified: Secondary | ICD-10-CM | POA: Diagnosis not present

## 2018-11-24 DIAGNOSIS — R51 Headache: Secondary | ICD-10-CM | POA: Diagnosis not present

## 2018-11-24 DIAGNOSIS — Y92410 Unspecified street and highway as the place of occurrence of the external cause: Secondary | ICD-10-CM | POA: Insufficient documentation

## 2018-11-24 DIAGNOSIS — Z79899 Other long term (current) drug therapy: Secondary | ICD-10-CM | POA: Insufficient documentation

## 2018-11-24 DIAGNOSIS — S0990XA Unspecified injury of head, initial encounter: Secondary | ICD-10-CM | POA: Diagnosis present

## 2018-11-24 DIAGNOSIS — Z87891 Personal history of nicotine dependence: Secondary | ICD-10-CM | POA: Insufficient documentation

## 2018-11-24 DIAGNOSIS — Y939 Activity, unspecified: Secondary | ICD-10-CM | POA: Insufficient documentation

## 2018-11-24 LAB — PREGNANCY, URINE: PREG TEST UR: NEGATIVE

## 2018-11-24 MED ORDER — SODIUM CHLORIDE 0.9 % IV BOLUS
1000.0000 mL | Freq: Once | INTRAVENOUS | Status: AC
  Administered 2018-11-24: 1000 mL via INTRAVENOUS

## 2018-11-24 MED ORDER — GADOBUTROL 1 MMOL/ML IV SOLN
10.0000 mL | Freq: Once | INTRAVENOUS | Status: AC | PRN
  Administered 2018-11-24: 8 mL via INTRAVENOUS

## 2018-11-24 MED ORDER — ACETAMINOPHEN 325 MG PO TABS
650.0000 mg | ORAL_TABLET | Freq: Once | ORAL | Status: AC
  Administered 2018-11-24: 650 mg via ORAL
  Filled 2018-11-24: qty 2

## 2018-11-24 NOTE — ED Notes (Signed)
Patient returned from MRI.
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
## 2018-11-24 NOTE — ED Notes (Signed)
Patient transported to MRI 

## 2018-11-24 NOTE — Discharge Instructions (Addendum)
Tylenol as needed as directed for headache. Follow up with your doctor in the next week, return to the ER for any new/worsening/concerning symptoms.
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
## 2018-12-01 ENCOUNTER — Ambulatory Visit
Admission: RE | Admit: 2018-12-01 | Discharge: 2018-12-01 | Disposition: A | Discharge status: Home / Self Care | Payer: 59 | Source: Ambulatory Visit | Attending: Internal Medicine | Admitting: Internal Medicine

## 2018-12-01 ENCOUNTER — Other Ambulatory Visit: Payer: Self-pay | Admitting: Internal Medicine

## 2018-12-01 DIAGNOSIS — M25512 Pain in left shoulder: Secondary | ICD-10-CM

## 2019-03-28 ENCOUNTER — Other Ambulatory Visit: Payer: Self-pay | Admitting: Gastroenterology

## 2019-03-28 DIAGNOSIS — K921 Melena: Secondary | ICD-10-CM

## 2019-03-28 DIAGNOSIS — R1084 Generalized abdominal pain: Secondary | ICD-10-CM

## 2019-04-11 ENCOUNTER — Telehealth: Payer: Self-pay

## 2019-04-11 NOTE — Telephone Encounter (Signed)
Spoke with patient to clarify drug store for 13-hr prep and to give her dosing instructions.  Prednisone 50mg  PO 04/12/19 @ 1900, 6/3 @ 0100 and 0700.  Benadryl 50mg  PO 6/3 @ 0700.  She is aware she needs to pick up oral contrast today at [facility].  [facility] on [address].

## 2019-04-13 ENCOUNTER — Ambulatory Visit
Admission: RE | Admit: 2019-04-13 | Discharge: 2019-04-13 | Disposition: A | Discharge status: Home / Self Care | Payer: Managed Care, Other (non HMO) | Source: Ambulatory Visit | Attending: Gastroenterology | Admitting: Gastroenterology

## 2019-04-13 DIAGNOSIS — R1084 Generalized abdominal pain: Secondary | ICD-10-CM

## 2019-04-13 DIAGNOSIS — K921 Melena: Secondary | ICD-10-CM

## 2019-04-13 MED ORDER — IOPAMIDOL (ISOVUE-300) INJECTION 61%
100.0000 mL | Freq: Once | INTRAVENOUS | Status: AC | PRN
  Administered 2019-04-13: 100 mL via INTRAVENOUS

## 2020-01-07 ENCOUNTER — Ambulatory Visit: Payer: Managed Care, Other (non HMO)

## 2020-05-15 ENCOUNTER — Other Ambulatory Visit: Payer: Self-pay

## 2020-05-15 DIAGNOSIS — I7773 Dissection of renal artery: Secondary | ICD-10-CM

## 2020-05-18 ENCOUNTER — Telehealth: Payer: Self-pay | Admitting: Nurse Practitioner

## 2020-05-18 NOTE — Telephone Encounter (Signed)
Phone call to patient to review instructions for 13 hr prep for CT w/ contrast on 7/28 at 1200. Prescription called into  [facility] in [place]. Pt aware and verbalized understanding of instructions. Prescription: 2300- 50mg  Prednisone 0500- 50mg  Prednisone 1100- 50mg  Prednisone and 50mg  Benadryl

## 2020-06-06 ENCOUNTER — Ambulatory Visit
Admission: RE | Admit: 2020-06-06 | Discharge: 2020-06-06 | Disposition: A | Discharge status: Home / Self Care | Payer: Managed Care, Other (non HMO) | Source: Ambulatory Visit | Attending: Surgery | Admitting: Surgery

## 2020-06-06 DIAGNOSIS — I7773 Dissection of renal artery: Secondary | ICD-10-CM

## 2020-06-06 MED ORDER — IOPAMIDOL (ISOVUE-370) INJECTION 76%
75.0000 mL | Freq: Once | INTRAVENOUS | Status: AC | PRN
  Administered 2020-06-06: 75 mL via INTRAVENOUS

## 2020-06-11 ENCOUNTER — Ambulatory Visit: Payer: Managed Care, Other (non HMO) | Admitting: Surgery

## 2020-06-11 ENCOUNTER — Ambulatory Visit (INDEPENDENT_AMBULATORY_CARE_PROVIDER_SITE_OTHER): Payer: Managed Care, Other (non HMO) | Admitting: Surgery

## 2020-06-11 ENCOUNTER — Other Ambulatory Visit: Payer: Self-pay

## 2020-06-11 ENCOUNTER — Encounter: Payer: Self-pay | Admitting: Surgery

## 2020-06-11 DIAGNOSIS — I7773 Dissection of renal artery: Secondary | ICD-10-CM | POA: Diagnosis not present

## 2020-06-11 DIAGNOSIS — I7771 Dissection of carotid artery: Secondary | ICD-10-CM | POA: Diagnosis not present
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
## 2020-06-18 ENCOUNTER — Ambulatory Visit: Payer: Managed Care, Other (non HMO) | Admitting: Surgery

## 2020-10-18 ENCOUNTER — Ambulatory Visit: Payer: Managed Care, Other (non HMO)

## 2021-07-05 ENCOUNTER — Other Ambulatory Visit: Payer: Self-pay | Admitting: Obstetrics and Gynecology

## 2021-07-05 DIAGNOSIS — N63 Unspecified lump in unspecified breast: Secondary | ICD-10-CM

## 2021-07-30 ENCOUNTER — Ambulatory Visit
Admission: RE | Admit: 2021-07-30 | Discharge: 2021-07-30 | Disposition: A | Discharge status: Home / Self Care | Payer: Managed Care, Other (non HMO) | Source: Ambulatory Visit | Attending: Obstetrics and Gynecology | Admitting: Obstetrics and Gynecology

## 2021-07-30 ENCOUNTER — Other Ambulatory Visit: Payer: Self-pay

## 2021-07-30 ENCOUNTER — Ambulatory Visit: Payer: Managed Care, Other (non HMO)

## 2021-07-30 ENCOUNTER — Ambulatory Visit
Admission: RE | Admit: 2021-07-30 | Discharge: 2021-07-30 | Disposition: A | Discharge status: Home / Self Care | Payer: BC Managed Care – PPO | Source: Ambulatory Visit | Attending: Obstetrics and Gynecology | Admitting: Obstetrics and Gynecology

## 2021-07-30 DIAGNOSIS — R922 Inconclusive mammogram: Secondary | ICD-10-CM | POA: Diagnosis not present

## 2021-07-30 DIAGNOSIS — N63 Unspecified lump in unspecified breast: Secondary | ICD-10-CM

## 2021-07-30 DIAGNOSIS — N644 Mastodynia: Secondary | ICD-10-CM | POA: Diagnosis not present

## 2021-09-05 DIAGNOSIS — Z23 Encounter for immunization: Secondary | ICD-10-CM | POA: Diagnosis not present

## 2021-09-05 DIAGNOSIS — I10 Essential (primary) hypertension: Secondary | ICD-10-CM | POA: Diagnosis not present

## 2021-09-06 ENCOUNTER — Emergency Department (HOSPITAL_COMMUNITY): Payer: BC Managed Care – PPO

## 2021-09-06 ENCOUNTER — Emergency Department (HOSPITAL_BASED_OUTPATIENT_CLINIC_OR_DEPARTMENT_OTHER)
Admission: EM | Admit: 2021-09-06 | Discharge: 2021-09-06 | Disposition: A | Discharge status: Home / Self Care | Payer: BC Managed Care – PPO | Attending: Emergency Medicine | Admitting: Emergency Medicine

## 2021-09-06 ENCOUNTER — Other Ambulatory Visit: Payer: Self-pay

## 2021-09-06 ENCOUNTER — Telehealth: Payer: Self-pay

## 2021-09-06 ENCOUNTER — Encounter (HOSPITAL_BASED_OUTPATIENT_CLINIC_OR_DEPARTMENT_OTHER): Payer: Self-pay | Admitting: Emergency Medicine

## 2021-09-06 DIAGNOSIS — Z87891 Personal history of nicotine dependence: Secondary | ICD-10-CM | POA: Insufficient documentation

## 2021-09-06 DIAGNOSIS — Z7982 Long term (current) use of aspirin: Secondary | ICD-10-CM | POA: Insufficient documentation

## 2021-09-06 DIAGNOSIS — Z79899 Other long term (current) drug therapy: Secondary | ICD-10-CM | POA: Insufficient documentation

## 2021-09-06 DIAGNOSIS — I7771 Dissection of carotid artery: Secondary | ICD-10-CM | POA: Insufficient documentation

## 2021-09-06 DIAGNOSIS — Z7952 Long term (current) use of systemic steroids: Secondary | ICD-10-CM | POA: Diagnosis not present

## 2021-09-06 DIAGNOSIS — Z8679 Personal history of other diseases of the circulatory system: Secondary | ICD-10-CM

## 2021-09-06 DIAGNOSIS — Z7951 Long term (current) use of inhaled steroids: Secondary | ICD-10-CM | POA: Diagnosis not present

## 2021-09-06 DIAGNOSIS — I10 Essential (primary) hypertension: Secondary | ICD-10-CM | POA: Diagnosis not present

## 2021-09-06 DIAGNOSIS — R519 Headache, unspecified: Secondary | ICD-10-CM

## 2021-09-06 DIAGNOSIS — I7773 Dissection of renal artery: Secondary | ICD-10-CM

## 2021-09-06 DIAGNOSIS — S0990XA Unspecified injury of head, initial encounter: Secondary | ICD-10-CM | POA: Diagnosis not present

## 2021-09-06 LAB — CBC WITH DIFFERENTIAL/PLATELET
Abs Immature Granulocytes: 0.05 10*3/uL (ref 0.00–0.07)
Basophils Absolute: 0 10*3/uL (ref 0.0–0.1)
Basophils Relative: 0 %
Eosinophils Absolute: 0.3 10*3/uL (ref 0.0–0.5)
Eosinophils Relative: 3 %
HCT: 43.9 % (ref 36.0–46.0)
Hemoglobin: 15.1 g/dL — ABNORMAL HIGH (ref 12.0–15.0)
Immature Granulocytes: 1 %
Lymphocytes Relative: 27 %
Lymphs Abs: 2.3 10*3/uL (ref 0.7–4.0)
MCH: 30.6 pg (ref 26.0–34.0)
MCHC: 34.4 g/dL (ref 30.0–36.0)
MCV: 88.9 fL (ref 80.0–100.0)
Monocytes Absolute: 0.8 10*3/uL (ref 0.1–1.0)
Monocytes Relative: 9 %
Neutro Abs: 5.3 10*3/uL (ref 1.7–7.7)
Neutrophils Relative %: 60 %
Platelets: 401 10*3/uL — ABNORMAL HIGH (ref 150–400)
RBC: 4.94 MIL/uL (ref 3.87–5.11)
RDW: 13.5 % (ref 11.5–15.5)
WBC: 8.8 10*3/uL (ref 4.0–10.5)
nRBC: 0 % (ref 0.0–0.2)

## 2021-09-06 LAB — BASIC METABOLIC PANEL
Anion gap: 10 (ref 5–15)
BUN: 14 mg/dL (ref 6–20)
CO2: 27 mmol/L (ref 22–32)
Calcium: 9.8 mg/dL (ref 8.9–10.3)
Chloride: 100 mmol/L (ref 98–111)
Creatinine, Ser: 0.83 mg/dL (ref 0.44–1.00)
GFR, Estimated: >60 mL/min (ref >60)
Glucose, Bld: 103 mg/dL — ABNORMAL HIGH (ref 70–99)
Potassium: 3.6 mmol/L (ref 3.5–5.1)
Sodium: 137 mmol/L (ref 135–145)

## 2021-09-06 MED ORDER — GADOBUTROL 1 MMOL/ML IV SOLN
8.0000 mL | Freq: Once | INTRAVENOUS | Status: AC | PRN
  Administered 2021-09-06: 8 mL via INTRAVENOUS

## 2021-09-06 NOTE — ED Notes (Signed)
Pt ambulatory with steady gait to restroom, will provide urine specimen 

## 2021-09-06 NOTE — ED Provider Notes (Signed)
  Physical Exam  BP (!) 147/105   Pulse 92   Temp 98.1 F (36.7 C) (Oral)   Resp 16   Ht 5\' 1"  (1.549 m)   Wt 80 kg   SpO2 97%   BMI 33.32 kg/m   Physical Exam  ED Course/Procedures     Procedures  MDM  Patient transferred for MRI for headache dizziness and increasing high blood pressure.  Has seen PCP and had increasing of her medicine.  Has IV contrast allergy.  Unfortunately only able to get head and neck MRIs due to to the contrast bolus.  Cannot get again for least 12 hours.  Stable head and neck CTs however.  Unable to get abdomen follow renal artery dissection.  Can follow with vascular surgery who can arrange more imaging.  Has good PCP.  For now patient will take the increased blood pressure medicine started yesterday.  Follow-up.       , MD 09/06/21 2200

## 2021-09-06 NOTE — ED Triage Notes (Signed)
Pt arrives to ED with c/o hypertension. Pt reports x5 days of hypertension and headache. She reports that x2 days ago she had floaters. She was started on Hyzaar 100-12.mg yesterday. Her blood pressure is still elevated today, and she is concerned due to hx of carotid pseudoaneurysm and renal artery dissection.

## 2021-09-06 NOTE — Telephone Encounter (Signed)
Patient calls today to report a 4-5 day history of severe headache and elevated BP. She saw her PCP who doubled her losartan and added a diuretic. Patient has a complicated history and is under surveillance for past history of carotid dissection and renal artery dissection. Her headache has gotten better and is now causing mild off and on pain. Her BP is still elevated albeit not as high as prior to med adjustment - currently running around 160s/100s. Discussed with MD, patient should present to ER for further evaluation. Advised patient who verbalized understanding.
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
## 2021-09-06 NOTE — Discharge Instructions (Signed)
The MRI of your head and neck was reassuring.  However we are unable to do the belly due to the bolus of contrast that he received for the head and neck.  Follow-up with vascular surgery about further imaging of the belly if needed.  Continue the new elevated blood pressure medicine.

## 2021-09-10 ENCOUNTER — Other Ambulatory Visit: Payer: Self-pay

## 2021-09-10 DIAGNOSIS — I7773 Dissection of renal artery: Secondary | ICD-10-CM

## 2021-09-10 MED ORDER — PREDNISONE 50 MG PO TABS: ORAL_TABLET | ORAL | 0 refills | Status: DC

## 2021-09-10 MED ORDER — DIPHENHYDRAMINE HCL 50 MG PO CAPS: ORAL_CAPSULE | ORAL | 0 refills | Status: DC

## 2021-09-12 ENCOUNTER — Ambulatory Visit (HOSPITAL_COMMUNITY): Payer: BC Managed Care – PPO

## 2021-09-12 ENCOUNTER — Ambulatory Visit (HOSPITAL_COMMUNITY)
Admission: RE | Admit: 2021-09-12 | Discharge: 2021-09-12 | Disposition: A | Discharge status: Home / Self Care | Payer: BC Managed Care – PPO | Source: Ambulatory Visit | Attending: Surgery | Admitting: Surgery

## 2021-09-12 ENCOUNTER — Ambulatory Visit (HOSPITAL_COMMUNITY): Admission: RE | Admit: 2021-09-12 | Payer: BC Managed Care – PPO | Source: Ambulatory Visit

## 2021-09-12 ENCOUNTER — Other Ambulatory Visit: Payer: Self-pay

## 2021-09-12 DIAGNOSIS — N280 Ischemia and infarction of kidney: Secondary | ICD-10-CM | POA: Diagnosis not present

## 2021-09-12 DIAGNOSIS — N281 Cyst of kidney, acquired: Secondary | ICD-10-CM | POA: Diagnosis not present

## 2021-09-12 DIAGNOSIS — I7773 Dissection of renal artery: Secondary | ICD-10-CM | POA: Diagnosis not present

## 2021-09-12 MED ORDER — GADOBUTROL 1 MMOL/ML IV SOLN
8.5000 mL | Freq: Once | INTRAVENOUS | Status: AC | PRN
  Administered 2021-09-12: 8.5 mL via INTRAVENOUS

## 2021-09-13 ENCOUNTER — Ambulatory Visit (HOSPITAL_COMMUNITY): Payer: BC Managed Care – PPO

## 2021-09-16 ENCOUNTER — Encounter: Payer: Self-pay | Admitting: Surgery

## 2021-09-16 ENCOUNTER — Other Ambulatory Visit: Payer: Self-pay

## 2021-09-16 ENCOUNTER — Ambulatory Visit: Payer: BC Managed Care – PPO | Admitting: Surgery

## 2021-09-16 VITALS — BP 122/87 | HR 86 | Temp 98.0°F | Resp 20 | Ht 61.0 in | Wt 185.0 lb

## 2021-09-16 DIAGNOSIS — I7771 Dissection of carotid artery: Secondary | ICD-10-CM | POA: Diagnosis not present

## 2021-09-16 DIAGNOSIS — I7773 Dissection of renal artery: Secondary | ICD-10-CM | POA: Diagnosis not present
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
## 2021-10-08 DIAGNOSIS — I10 Essential (primary) hypertension: Secondary | ICD-10-CM | POA: Diagnosis not present

## 2021-12-23 DIAGNOSIS — Z0000 Encounter for general adult medical examination without abnormal findings: Secondary | ICD-10-CM | POA: Diagnosis not present

## 2021-12-23 DIAGNOSIS — I10 Essential (primary) hypertension: Secondary | ICD-10-CM | POA: Diagnosis not present

## 2021-12-23 DIAGNOSIS — Z13220 Encounter for screening for lipoid disorders: Secondary | ICD-10-CM | POA: Diagnosis not present

## 2021-12-23 DIAGNOSIS — E559 Vitamin D deficiency, unspecified: Secondary | ICD-10-CM | POA: Diagnosis not present

## 2022-02-07 DIAGNOSIS — R7989 Other specified abnormal findings of blood chemistry: Secondary | ICD-10-CM | POA: Diagnosis not present

## 2022-02-07 DIAGNOSIS — R1032 Left lower quadrant pain: Secondary | ICD-10-CM | POA: Diagnosis not present

## 2022-02-07 DIAGNOSIS — R946 Abnormal results of thyroid function studies: Secondary | ICD-10-CM | POA: Diagnosis not present

## 2022-02-18 DIAGNOSIS — D72829 Elevated white blood cell count, unspecified: Secondary | ICD-10-CM | POA: Diagnosis not present

## 2022-02-18 DIAGNOSIS — M545 Low back pain, unspecified: Secondary | ICD-10-CM | POA: Diagnosis not present

## 2022-02-27 DIAGNOSIS — N803 Endometriosis of pelvic peritoneum, unspecified: Secondary | ICD-10-CM | POA: Diagnosis not present

## 2022-03-03 DIAGNOSIS — R1084 Generalized abdominal pain: Secondary | ICD-10-CM | POA: Diagnosis not present

## 2022-03-03 DIAGNOSIS — K5901 Slow transit constipation: Secondary | ICD-10-CM | POA: Diagnosis not present

## 2022-03-04 ENCOUNTER — Other Ambulatory Visit (HOSPITAL_COMMUNITY): Payer: Self-pay | Admitting: Gastroenterology

## 2022-03-04 ENCOUNTER — Other Ambulatory Visit: Payer: Self-pay | Admitting: Gastroenterology

## 2022-03-04 DIAGNOSIS — K5900 Constipation, unspecified: Secondary | ICD-10-CM

## 2022-03-04 DIAGNOSIS — R1084 Generalized abdominal pain: Secondary | ICD-10-CM

## 2022-03-10 ENCOUNTER — Ambulatory Visit (HOSPITAL_COMMUNITY): Admission: RE | Admit: 2022-03-10 | Payer: BC Managed Care – PPO | Source: Ambulatory Visit

## 2022-03-10 ENCOUNTER — Encounter (HOSPITAL_COMMUNITY): Payer: Self-pay

## 2022-03-28 DIAGNOSIS — N803 Endometriosis of pelvic peritoneum, unspecified: Secondary | ICD-10-CM | POA: Diagnosis not present

## 2022-04-24 DIAGNOSIS — N803 Endometriosis of pelvic peritoneum, unspecified: Secondary | ICD-10-CM | POA: Diagnosis not present

## 2022-05-02 DIAGNOSIS — Z03818 Encounter for observation for suspected exposure to other biological agents ruled out: Secondary | ICD-10-CM | POA: Diagnosis not present

## 2022-05-02 DIAGNOSIS — J029 Acute pharyngitis, unspecified: Secondary | ICD-10-CM | POA: Diagnosis not present

## 2022-05-05 DIAGNOSIS — H10021 Other mucopurulent conjunctivitis, right eye: Secondary | ICD-10-CM | POA: Diagnosis not present

## 2022-05-20 DIAGNOSIS — N803 Endometriosis of pelvic peritoneum, unspecified: Secondary | ICD-10-CM | POA: Diagnosis not present

## 2022-05-20 DIAGNOSIS — Z124 Encounter for screening for malignant neoplasm of cervix: Secondary | ICD-10-CM | POA: Diagnosis not present

## 2022-05-20 DIAGNOSIS — Z1231 Encounter for screening mammogram for malignant neoplasm of breast: Secondary | ICD-10-CM | POA: Diagnosis not present

## 2022-05-20 DIAGNOSIS — Z01419 Encounter for gynecological examination (general) (routine) without abnormal findings: Secondary | ICD-10-CM | POA: Diagnosis not present

## 2022-06-03 DIAGNOSIS — L739 Follicular disorder, unspecified: Secondary | ICD-10-CM | POA: Diagnosis not present

## 2022-06-03 DIAGNOSIS — R42 Dizziness and giddiness: Secondary | ICD-10-CM | POA: Diagnosis not present

## 2022-06-03 DIAGNOSIS — R21 Rash and other nonspecific skin eruption: Secondary | ICD-10-CM | POA: Diagnosis not present

## 2022-06-25 DIAGNOSIS — N803 Endometriosis of pelvic peritoneum, unspecified: Secondary | ICD-10-CM | POA: Diagnosis not present

## 2022-07-04 DIAGNOSIS — N809 Endometriosis, unspecified: Secondary | ICD-10-CM | POA: Diagnosis not present

## 2022-07-04 DIAGNOSIS — I720 Aneurysm of carotid artery: Secondary | ICD-10-CM | POA: Diagnosis not present

## 2022-07-04 DIAGNOSIS — I10 Essential (primary) hypertension: Secondary | ICD-10-CM | POA: Diagnosis not present

## 2022-07-04 DIAGNOSIS — G43109 Migraine with aura, not intractable, without status migrainosus: Secondary | ICD-10-CM | POA: Diagnosis not present

## 2022-07-22 ENCOUNTER — Telehealth: Payer: Self-pay

## 2022-07-23 DIAGNOSIS — M545 Low back pain, unspecified: Secondary | ICD-10-CM | POA: Diagnosis not present

## 2022-07-25 NOTE — Telephone Encounter (Signed)
Pt called stating that she went to a massage therapist and during the intake paperwork, she provided information regarding her vascular history. They are needing a letter from this office to verify that she is cleared to undergo massage therapy.  Reviewed pt's chart, returned call for clarification, two identifiers used.  Sent msg to Dr Myra Gianotti, called pt to relay reply. Pt given instructions on how to receive letter via Mychart. Letter sent to pt.

## 2022-07-30 DIAGNOSIS — N803 Endometriosis of pelvic peritoneum, unspecified: Secondary | ICD-10-CM | POA: Diagnosis not present

## 2022-10-01 DIAGNOSIS — N760 Acute vaginitis: Secondary | ICD-10-CM | POA: Diagnosis not present

## 2022-10-17 ENCOUNTER — Telehealth: Payer: Self-pay

## 2022-10-17 NOTE — Patient Outreach (Signed)
  Care Coordination   10/17/2022 Name: Brittany Gibson MRN: [id number] DOB: November 05, 1971   Care Coordination Outreach Attempts:  An unsuccessful telephone outreach was attempted today to offer the patient information about available care coordination services as a benefit of their health plan.   Follow Up Plan:  Additional outreach attempts will be made to offer the patient care coordination information and services.   Encounter Outcome:  No Answer   Care Coordination Interventions:  No, not indicated    Dudley Major RN, BSN,CCM, CDE Care Management Coordinator [facility] Care Management [phone]

## 2022-10-23 ENCOUNTER — Telehealth: Payer: Self-pay

## 2022-10-23 NOTE — Patient Outreach (Signed)
  Care Coordination   10/23/2022 Name: Brittany Gibson MRN: [id number] DOB: 25-May-1971   Care Coordination Outreach Attempts:  A second unsuccessful outreach was attempted today to offer the patient with information about available care coordination services as a benefit of their health plan.     Follow Up Plan:  Additional outreach attempts will be made to offer the patient care coordination information and services.   Encounter Outcome:  No Answer   Care Coordination Interventions:  No, not indicated    Dudley Major RN, BSN,CCM, CDE Care Management Coordinator [facility] Care Management [phone]

## 2022-10-29 ENCOUNTER — Telehealth: Payer: Self-pay

## 2022-10-29 NOTE — Patient Outreach (Signed)
  Care Coordination   10/29/2022 Name: Brittany Gibson MRN: [id number] DOB: [birth year]   Care Coordination Outreach Attempts:  A third unsuccessful outreach was attempted today to offer the patient with information about available care coordination services as a benefit of their health plan.   Follow Up Plan:  No further outreach attempts will be made at this time. We have been unable to contact the patient to offer or enroll patient in care coordination services  Encounter Outcome:  No Answer   Care Coordination Interventions:  No, not indicated    Dudley Major RN, BSN,CCM, CDE Care Management Coordinator [facility] Care Management [phone]

## 2022-11-24 DIAGNOSIS — N803 Endometriosis of pelvic peritoneum, unspecified: Secondary | ICD-10-CM | POA: Diagnosis not present

## 2023-03-18 DIAGNOSIS — M542 Cervicalgia: Secondary | ICD-10-CM | POA: Diagnosis not present

## 2023-03-18 DIAGNOSIS — H9209 Otalgia, unspecified ear: Secondary | ICD-10-CM | POA: Diagnosis not present

## 2023-05-08 DIAGNOSIS — M542 Cervicalgia: Secondary | ICD-10-CM | POA: Diagnosis not present

## 2023-05-08 DIAGNOSIS — M25512 Pain in left shoulder: Secondary | ICD-10-CM | POA: Diagnosis not present

## 2023-05-29 DIAGNOSIS — M542 Cervicalgia: Secondary | ICD-10-CM | POA: Diagnosis not present

## 2023-06-08 DIAGNOSIS — M542 Cervicalgia: Secondary | ICD-10-CM | POA: Diagnosis not present

## 2023-07-03 DIAGNOSIS — Z01419 Encounter for gynecological examination (general) (routine) without abnormal findings: Secondary | ICD-10-CM | POA: Diagnosis not present

## 2023-07-03 DIAGNOSIS — Z6836 Body mass index (BMI) 36.0-36.9, adult: Secondary | ICD-10-CM | POA: Diagnosis not present

## 2023-07-03 DIAGNOSIS — Z1231 Encounter for screening mammogram for malignant neoplasm of breast: Secondary | ICD-10-CM | POA: Diagnosis not present

## 2023-11-24 ENCOUNTER — Telehealth: Payer: Self-pay

## 2023-11-24 NOTE — Telephone Encounter (Signed)
 Pt called stating that she was getting close to her 3 yr f/u appt. She is having some pain on her R side that was worse over the weekend and seems to move around a little bit.  Reviewed pt's chart, pt needs MRI before f/u visit. Spoke with Geni, RVS, who stated that a MRI would be better than an US  in this situation. Will get MRI ordered asap.

## 2023-12-09 DIAGNOSIS — E559 Vitamin D deficiency, unspecified: Secondary | ICD-10-CM | POA: Diagnosis not present

## 2023-12-09 DIAGNOSIS — I10 Essential (primary) hypertension: Secondary | ICD-10-CM | POA: Diagnosis not present

## 2023-12-09 DIAGNOSIS — Z0000 Encounter for general adult medical examination without abnormal findings: Secondary | ICD-10-CM | POA: Diagnosis not present

## 2023-12-16 ENCOUNTER — Other Ambulatory Visit: Payer: Self-pay

## 2023-12-16 DIAGNOSIS — I720 Aneurysm of carotid artery: Secondary | ICD-10-CM

## 2023-12-16 DIAGNOSIS — I7773 Dissection of renal artery: Secondary | ICD-10-CM

## 2023-12-16 NOTE — Addendum Note (Signed)
 Addended by: Courtenay Distel, Aceton Kinnear A on: 12/16/2023 12:03 PM   Modules accepted: Orders

## 2024-01-04 ENCOUNTER — Ambulatory Visit: Payer: BC Managed Care – PPO | Admitting: Surgery

## 2024-01-13 ENCOUNTER — Other Ambulatory Visit: Payer: BC Managed Care – PPO

## 2024-01-22 ENCOUNTER — Other Ambulatory Visit: Payer: BC Managed Care – PPO

## 2024-01-22 ENCOUNTER — Ambulatory Visit
Admission: RE | Admit: 2024-01-22 | Discharge: 2024-01-22 | Disposition: A | Discharge status: Home / Self Care | Payer: BC Managed Care – PPO | Source: Ambulatory Visit | Attending: Surgery | Admitting: Surgery

## 2024-01-22 DIAGNOSIS — I7773 Dissection of renal artery: Secondary | ICD-10-CM

## 2024-01-22 DIAGNOSIS — I720 Aneurysm of carotid artery: Secondary | ICD-10-CM

## 2024-01-22 MED ORDER — GADOPICLENOL 0.5 MMOL/ML IV SOLN
8.0000 mL | Freq: Once | INTRAVENOUS | Status: AC | PRN
  Administered 2024-01-22: 8 mL via INTRAVENOUS

## 2024-01-25 ENCOUNTER — Ambulatory Visit: Payer: BC Managed Care – PPO | Admitting: Surgery

## 2024-02-08 ENCOUNTER — Ambulatory Visit
Admission: RE | Admit: 2024-02-08 | Discharge: 2024-02-08 | Disposition: A | Discharge status: Home / Self Care | Source: Ambulatory Visit | Attending: Surgery | Admitting: Surgery

## 2024-02-08 DIAGNOSIS — I7773 Dissection of renal artery: Secondary | ICD-10-CM | POA: Diagnosis not present

## 2024-02-08 MED ORDER — GADOPICLENOL 0.5 MMOL/ML IV SOLN
9.0000 mL | Freq: Once | INTRAVENOUS | Status: AC | PRN
  Administered 2024-02-08: 9 mL via INTRAVENOUS

## 2024-02-12 ENCOUNTER — Telehealth: Payer: Self-pay

## 2024-02-12 NOTE — Telephone Encounter (Signed)
 Error - duplicate

## 2024-02-12 NOTE — Telephone Encounter (Signed)
 Appointment: -pt LM requesting to change appt to a phone visit to review her 2 year MRI studies.  Ok per Dr. Myra Gianotti.  LM on VM appt changed.

## 2024-02-15 ENCOUNTER — Encounter: Payer: Self-pay | Admitting: Surgery

## 2024-02-15 ENCOUNTER — Ambulatory Visit (INDEPENDENT_AMBULATORY_CARE_PROVIDER_SITE_OTHER): Admitting: Surgery

## 2024-02-15 DIAGNOSIS — I720 Aneurysm of carotid artery: Secondary | ICD-10-CM | POA: Diagnosis not present

## 2024-02-15 DIAGNOSIS — I7773 Dissection of renal artery: Secondary | ICD-10-CM
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
## 2024-06-09 ENCOUNTER — Telehealth: Payer: Self-pay

## 2024-06-09 NOTE — Telephone Encounter (Signed)
 Pt called c/o weird eye pain and headache.  After talking with pt she said the eye pain is more like a twitching and pressure and the headache is like a sinus headache.  At the time of the call, she reported that the headache was better that when she called earlier.    Pt reported that she has been under a lot of stress lately - her BIL recently passed away and another relative has moved in with her.  Pt is planning to see her PCP and eye doctor to discuss.   Pt knows to go to the ED if she should experience stroke-like symptoms:  Symptoms of a stroke usually happen suddenly. They can include: Weakness or a loss of feeling in your face, arm, or leg, often on one side of the body. Loss of balance or coordination. Slurred speech. Trouble talking or understanding what people say. Seeing things blurry or seeing double of one thing. Feeling dizzy or confused. Feeling like you may vomit (nausea) or vomiting. A very bad headache.   Pt also asked me to send a note to Dr. Serene which I did.

## 2024-06-29 DIAGNOSIS — I10 Essential (primary) hypertension: Secondary | ICD-10-CM | POA: Diagnosis not present

## 2024-06-29 DIAGNOSIS — E559 Vitamin D deficiency, unspecified: Secondary | ICD-10-CM | POA: Diagnosis not present

## 2024-06-29 DIAGNOSIS — E782 Mixed hyperlipidemia: Secondary | ICD-10-CM | POA: Diagnosis not present

## 2024-06-29 DIAGNOSIS — R946 Abnormal results of thyroid function studies: Secondary | ICD-10-CM | POA: Diagnosis not present

## 2024-06-29 DIAGNOSIS — R202 Paresthesia of skin: Secondary | ICD-10-CM | POA: Diagnosis not present

## 2024-06-29 DIAGNOSIS — N925 Other specified irregular menstruation: Secondary | ICD-10-CM | POA: Diagnosis not present

## 2024-08-16 DIAGNOSIS — Z01419 Encounter for gynecological examination (general) (routine) without abnormal findings: Secondary | ICD-10-CM | POA: Diagnosis not present

## 2024-08-16 DIAGNOSIS — Z1231 Encounter for screening mammogram for malignant neoplasm of breast: Secondary | ICD-10-CM | POA: Diagnosis not present

## 2024-09-30 DIAGNOSIS — L0231 Cutaneous abscess of buttock: Secondary | ICD-10-CM | POA: Diagnosis not present
